# Patient Record
Sex: Male | Born: 1975 | Race: White | Hispanic: No | Marital: Married | Smoking: Former smoker
Health system: Southern US, Community
[De-identification: ages and names within clinical notes are randomized; demographics above are authoritative.]

## PROBLEM LIST (undated history)

## (undated) DIAGNOSIS — K2970 Gastritis, unspecified, without bleeding: Secondary | ICD-10-CM

## (undated) DIAGNOSIS — E66811 Obesity, class 1: Secondary | ICD-10-CM

## (undated) DIAGNOSIS — K2990 Gastroduodenitis, unspecified, without bleeding: Secondary | ICD-10-CM

## (undated) DIAGNOSIS — I10 Essential (primary) hypertension: Secondary | ICD-10-CM

## (undated) DIAGNOSIS — J309 Allergic rhinitis, unspecified: Secondary | ICD-10-CM

## (undated) DIAGNOSIS — L509 Urticaria, unspecified: Secondary | ICD-10-CM

## (undated) DIAGNOSIS — G4733 Obstructive sleep apnea (adult) (pediatric): Secondary | ICD-10-CM

## (undated) DIAGNOSIS — R002 Palpitations: Secondary | ICD-10-CM

## (undated) DIAGNOSIS — M19072 Primary osteoarthritis, left ankle and foot: Secondary | ICD-10-CM

## (undated) DIAGNOSIS — T7840XA Allergy, unspecified, initial encounter: Secondary | ICD-10-CM

## (undated) DIAGNOSIS — N200 Calculus of kidney: Secondary | ICD-10-CM

## (undated) DIAGNOSIS — M19071 Primary osteoarthritis, right ankle and foot: Secondary | ICD-10-CM

## (undated) DIAGNOSIS — E669 Obesity, unspecified: Secondary | ICD-10-CM

## (undated) DIAGNOSIS — G4730 Sleep apnea, unspecified: Secondary | ICD-10-CM

## (undated) DIAGNOSIS — Z52008 Unspecified donor, other blood: Secondary | ICD-10-CM

## (undated) HISTORY — DX: Urticaria, unspecified: L50.9

## (undated) HISTORY — DX: Primary osteoarthritis, right ankle and foot: M19.071

## (undated) HISTORY — DX: Obesity, unspecified: E66.9

## (undated) HISTORY — DX: Obesity, class 1: E66.811

## (undated) HISTORY — DX: Allergic rhinitis, unspecified: J30.9

## (undated) HISTORY — PX: LITHOTRIPSY: SUR834

## (undated) HISTORY — DX: Obstructive sleep apnea (adult) (pediatric): G47.33

## (undated) HISTORY — DX: Gastroduodenitis, unspecified, without bleeding: K29.90

## (undated) HISTORY — DX: Essential (primary) hypertension: I10

## (undated) HISTORY — DX: Gastritis, unspecified, without bleeding: K29.70

## (undated) HISTORY — DX: Unspecified donor, other blood: Z52.008

## (undated) HISTORY — PX: ACHILLES TENDON LENGTHENING: SUR826

## (undated) HISTORY — PX: VASECTOMY: SHX75

## (undated) HISTORY — DX: Allergy, unspecified, initial encounter: T78.40XA

## (undated) HISTORY — DX: Primary osteoarthritis, right ankle and foot: M19.072

## (undated) HISTORY — DX: Palpitations: R00.2

## (undated) HISTORY — DX: Calculus of kidney: N20.0

## (undated) HISTORY — DX: Sleep apnea, unspecified: G47.30

---
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
## 1994-10-23 HISTORY — PX: OTHER SURGICAL HISTORY: SHX169

## 2009-06-09 ENCOUNTER — Emergency Department (HOSPITAL_COMMUNITY): Admission: EM | Admit: 2009-06-09 | Discharge: 2009-06-09 | Payer: Self-pay | Admitting: Emergency Medicine

## 2011-03-31 ENCOUNTER — Ambulatory Visit: Payer: Self-pay | Admitting: Family Medicine

## 2011-04-03 ENCOUNTER — Telehealth: Payer: Self-pay | Admitting: Family Medicine

## 2011-04-03 ENCOUNTER — Encounter: Payer: Self-pay | Admitting: Family Medicine

## 2011-04-03 ENCOUNTER — Ambulatory Visit (INDEPENDENT_AMBULATORY_CARE_PROVIDER_SITE_OTHER): Payer: BC Managed Care – PPO | Admitting: Family Medicine

## 2011-04-03 VITALS — BP 134/83 | HR 70 | Temp 98.2°F | Ht 68.0 in | Wt 202.4 lb

## 2011-04-03 DIAGNOSIS — I10 Essential (primary) hypertension: Secondary | ICD-10-CM

## 2011-04-03 DIAGNOSIS — Z23 Encounter for immunization: Secondary | ICD-10-CM

## 2011-04-03 LAB — POCT URINALYSIS DIPSTICK
Bilirubin, UA: NEGATIVE
Glucose, UA: NEGATIVE
Leukocytes, UA: NEGATIVE
Protein, UA: NEGATIVE
Spec Grav, UA: 1.02

## 2011-04-03 MED ORDER — NEBIVOLOL HCL 10 MG PO TABS: 10.0000 mg | ORAL_TABLET | Freq: Every day | ORAL | Status: DC

## 2011-04-03 NOTE — Telephone Encounter (Signed)
Pls request records from Dr. Myers at [facility] in [place].  Thx--PM 

## 2011-04-03 NOTE — Telephone Encounter (Signed)
Pls request records from Dr. Izola Price at [facility] in [place].  Thx--PM

## 2011-04-03 NOTE — Progress Notes (Signed)
Office Note 04/03/2011  CC:  Chief Complaint  Patient presents with  . Establish Care    new patient  . Hypertension    HPI:  Steve Salinas is a 35 y.o. White male who is here to establish care and discuss HTN. Patient's most recent primary MD: Dr. Izola Price, [facility] in [place] Old records were not reviewed prior to or during today's visit.  Pt dx'd with labile HTN about a year ago (bp up only when in pain/injured), eventually weened himself off of bystolic b/c bp nl and then about 10d ago restarted this b/c of another visit to ED for pain (aggravated old right shoulder injury) and bp way up again.  Of note, he denies any sx's with his high bp (190s syst, 100s diast).  No bp monitoring at home.  Has been doing low Na/Dash-type diet.  Does not exercise but does yard work daily. Nonsmoker.  Past Medical History  Diagnosis Date  . Allergy     seasonal, constant  . Hypertension   . Chicken pox 35 yrs old  . Nephrolithiasis     Past Surgical History  Procedure Date  . Kidney stone removed 96    Dr. Isabel Caprice is urologist locally.  . Lithotripsy 1997 and 2005  . Achilles tendon lengthening 35 yrs old  . Tympanostomy tube placement 1981  . Hernia repair 1994    Family History  Problem Relation Age of Onset  . Hypertension Mother   . Hyperlipidemia Mother   . Arthritis Mother   . Heart attack Mother   . Arthritis Father   . Hyperlipidemia Father   . Hypertension Father   . Heart disease Father     stents  . Hypertension Maternal Grandmother   . Heart attack Maternal Grandfather     History   Social History  . Marital Status: Married    Spouse Name: N/A    Number of Children: N/A  . Years of Education: N/A   Occupational History  . Not on file.   Social History Main Topics  . Smoking status: Former Smoker -- 1.0 packs/day for 11 years    Types: Cigarettes    Quit date: 10/23/1998  . Smokeless tobacco: Never Used  . Alcohol Use: No  . Drug Use: No  . Sexually  Active: Yes -- Male partner(s)   Other Topics Concern  . Not on file   Social History Narrative   Married, 3 young children.Occupation: Systems analyst.Originally from [place].  No tobacco, alc, or drugs.No formal exercise but works in yard daily.No OTC decongestants.  16 oz coffee/day.    Outpatient Encounter Prescriptions as of 04/03/2011  Medication Sig Dispense Refill  . cetirizine (ZYRTEC) 10 MG tablet Take 10 mg by mouth daily.        . nebivolol (BYSTOLIC) 10 MG tablet Take 1 tablet (10 mg total) by mouth daily.  30 tablet  6  . DISCONTD: nebivolol (BYSTOLIC) 10 MG tablet Take 10 mg by mouth daily.          No Known Allergies  ROS Review of Systems  Constitutional: Negative for fever, chills, appetite change and fatigue.  HENT: Negative for ear pain, congestion, sore throat, neck stiffness and dental problem.   Eyes: Negative for discharge, redness and visual disturbance.  Respiratory: Negative for cough, chest tightness, shortness of breath and wheezing.   Cardiovascular: Negative for chest pain, palpitations and leg swelling.  Gastrointestinal: Negative for nausea, vomiting, abdominal pain, diarrhea and blood in stool.  Genitourinary: Negative for dysuria, urgency, frequency, hematuria, flank pain and difficulty urinating.  Musculoskeletal: Positive for arthralgias (right shoulder injured playing pick-up football again about 2 wks ago). Negative for myalgias, back pain and joint swelling.  Skin: Negative for pallor and rash.  Neurological: Negative for dizziness, speech difficulty, weakness and headaches.  Hematological: Negative for adenopathy. Does not bruise/bleed easily.  Psychiatric/Behavioral: Negative for confusion and sleep disturbance. The patient is not nervous/anxious.     PE; Blood pressure 134/83, pulse 70, temperature 98.2 F (36.8 C), temperature source Oral, height 5\' 8"  (1.727 m), weight 202 lb 6.4 oz (91.808 kg), SpO2 99.00%. Gen: Alert, well  appearing.  Patient is oriented to person, place, time, and situation. HEENT: Scalp without lesions or hair loss.  Ears: EACs clear, normal epithelium.  TMs with good light reflex and landmarks bilaterally.  Eyes: no injection, icteris, swelling, or exudate.  EOMI, PERRLA. Nose: no drainage or turbinate edema/swelling.  No injection or focal lesion.  Mouth: lips without lesion/swelling.  Oral mucosa pink and moist.  Dentition intact and without obvious caries or gingival swelling.  Oropharynx without erythema, exudate, or swelling.  Neck: supple, ROM full.  Carotids 2+ bilat, without bruit.  No lymphadenopathy, thyromegaly, or mass. Chest: symmetric expansion, nonlabored respirations.  Clear and equal breath sounds in all lung fields.   CV: RRR, no m/r/g.  Peripheral pulses 2+ and symmetric. ABD: soft, NT, ND, BS normal.  No hepatospenomegaly or mass.  No bruits. EXT: no clubbing, cyanosis, or edema.   Pertinent labs:  CC UA today: normal  ASSESSMENT AND PLAN:  New patient:   HTN (hypertension), benign Problem stable.  Continue current medications and diet appropriate for this condition.  We have reviewed our general long term plan for this problem and also reviewed symptoms and signs that should prompt the patient to call or return to the office. Samples given and rx sent to pharmacy. Labs ordered for when fasting this week. Will get records from [facility] [place].     Return in about 4 months (around 08/03/2011) for 4 mo for o/v.  Lab visit this week for fasting labs (orders in EPIC).Marland Kitchen

## 2011-04-03 NOTE — Assessment & Plan Note (Addendum)
Problem stable.  Continue current medications and diet appropriate for this condition.  We have reviewed our general long term plan for this problem and also reviewed symptoms and signs that should prompt the patient to call or return to the office. Samples given and rx sent to pharmacy. Labs ordered for when fasting this week. Will get records from [facility] at [place].

## 2011-04-03 NOTE — Assessment & Plan Note (Deleted)
TdaP given today. 

## 2011-04-04 NOTE — Telephone Encounter (Signed)
Faxed request

## 2011-04-05 ENCOUNTER — Other Ambulatory Visit (INDEPENDENT_AMBULATORY_CARE_PROVIDER_SITE_OTHER): Payer: BC Managed Care – PPO

## 2011-04-05 DIAGNOSIS — I10 Essential (primary) hypertension: Secondary | ICD-10-CM

## 2011-04-05 LAB — CBC WITH DIFFERENTIAL/PLATELET
Basophils Relative: 0.6 % (ref 0.0–3.0)
Eosinophils Relative: 3.7 % (ref 0.0–5.0)
HCT: 43.9 % (ref 39.0–52.0)
Hemoglobin: 15.1 g/dL (ref 13.0–17.0)
Lymphs Abs: 1.7 10*3/uL (ref 0.7–4.0)
MCV: 89.8 fl (ref 78.0–100.0)
Monocytes Absolute: 0.6 10*3/uL (ref 0.1–1.0)
Monocytes Relative: 9.1 % (ref 3.0–12.0)
Platelets: 171 10*3/uL (ref 150.0–400.0)
RBC: 4.89 Mil/uL (ref 4.22–5.81)
WBC: 6.1 10*3/uL (ref 4.5–10.5)

## 2011-04-05 LAB — TSH: TSH: 0.96 u[IU]/mL (ref 0.35–5.50)

## 2011-04-05 LAB — COMPREHENSIVE METABOLIC PANEL
Albumin: 4.4 g/dL (ref 3.5–5.2)
BUN: 18 mg/dL (ref 6–23)
CO2: 27 mEq/L (ref 19–32)
GFR: 102.06 mL/min (ref >60.00)
Glucose, Bld: 77 mg/dL (ref 70–99)
Sodium: 138 mEq/L (ref 135–145)
Total Bilirubin: 0.5 mg/dL (ref 0.3–1.2)
Total Protein: 7.4 g/dL (ref 6.0–8.3)

## 2011-04-05 LAB — LIPID PANEL: Cholesterol: 181 mg/dL (ref 0–200)

## 2011-04-18 ENCOUNTER — Encounter: Payer: Self-pay | Admitting: Family Medicine

## 2011-05-29 ENCOUNTER — Encounter: Payer: Self-pay | Admitting: Family Medicine

## 2011-05-29 ENCOUNTER — Ambulatory Visit (INDEPENDENT_AMBULATORY_CARE_PROVIDER_SITE_OTHER): Payer: BC Managed Care – PPO | Admitting: Family Medicine

## 2011-05-29 VITALS — BP 110/72 | HR 64 | Temp 97.4°F | Ht 68.0 in | Wt 203.0 lb

## 2011-05-29 DIAGNOSIS — J209 Acute bronchitis, unspecified: Secondary | ICD-10-CM | POA: Insufficient documentation

## 2011-05-29 MED ORDER — BENZONATATE 100 MG PO CAPS: 100.0000 mg | ORAL_CAPSULE | Freq: Three times a day (TID) | ORAL | Status: AC | PRN

## 2011-05-29 MED ORDER — HYDROCODONE-HOMATROPINE 5-1.5 MG/5ML PO SYRP: ORAL_SOLUTION | ORAL | Status: DC

## 2011-05-29 NOTE — Progress Notes (Signed)
OFFICE NOTE  05/29/2011  CC:  Chief Complaint  Patient presents with  . Cough    hacking     HPI:   Patient is a 35 y.o. Caucasian male who is here for cough. Onset 5d/a, dry and deep/forceful, keeping him up at night lately.  No fever, no wheezing, no SOB. Chest hurts when coughing.  No ST, no HA. Has had some intermittently worsened chronic rhinitis for the last 67mo, gets exposed to family members with URIs and gets worse sx's for a week or so.  Pertinent PMH:  HTN Allergic rhinitis No hx of asthma or tobacco abuse. MEDS;   Outpatient Prescriptions Prior to Visit  Medication Sig Dispense Refill  . cetirizine (ZYRTEC) 10 MG tablet Take 10 mg by mouth daily.        . nebivolol (BYSTOLIC) 10 MG tablet Take 1 tablet (10 mg total) by mouth daily.  30 tablet  6    PE: Blood pressure 110/72, pulse 64, temperature 97.4 F (36.3 C), temperature source Temporal, height 5\' 8"  (1.727 m), weight 203 lb (92.08 kg), SpO2 99.00%. VS: noted--normal. Gen: alert, NAD, NONTOXIC APPEARING. HEENT: eyes without injection, drainage, or swelling.  Ears: EACs clear, TMs with normal light reflex and landmarks.  Nose: Clear rhinorrhea, with some dried, crusty exudate adherent to mildly injected mucosa.  No purulent d/c.  No paranasal sinus TTP.  No facial swelling.  Throat and mouth without focal lesion.  No pharyngial swelling, erythema, or exudate.   Neck: supple, no LAD.   LUNGS: CTA bilat, nonlabored resps.   CV: RRR, no m/r/g. EXT: no c/c/e SKIN: no rash    IMPRESSION AND PLAN:  Acute bronchitis No sign of RAD, no s/s to suggest bacterial etiology. D/C delsym and try tessalon pearls for daytime cough, hycodan hs for night-time cough. Self limited nature of the illness was discussed.  Expect gradual resolution over 2 wks or so. Signs of worsening illness discussed.   Return prn.     FOLLOW UP:  Return if symptoms worsen or fail to improve.

## 2011-05-29 NOTE — Assessment & Plan Note (Signed)
No sign of RAD, no s/s to suggest bacterial etiology. D/C delsym and try tessalon pearls for daytime cough, hycodan hs for night-time cough. Self limited nature of the illness was discussed.  Expect gradual resolution over 2 wks or so. Signs of worsening illness discussed.   Return prn.

## 2011-06-02 ENCOUNTER — Telehealth: Payer: Self-pay | Admitting: Family Medicine

## 2011-06-02 MED ORDER — ALBUTEROL SULFATE HFA 108 (90 BASE) MCG/ACT IN AERS: 2.0000 | INHALATION_SPRAY | Freq: Three times a day (TID) | RESPIRATORY_TRACT | Status: DC | PRN

## 2011-06-02 NOTE — Telephone Encounter (Signed)
See previously

## 2011-06-02 NOTE — Telephone Encounter (Signed)
I just need to know what his symptoms are, coughing, wheezing, SOB, fevers etc. If he is having some SOB and wheezing and has had this on occasion before when he has gotten sick I am willing to let him have an inhaler, ie Ventolin 2 puffs po q 8 hours prn sob/wheeze, disp:#1 no rf. If it is more than that let me know

## 2011-06-02 NOTE — Telephone Encounter (Signed)
Patient said that Dr Milinda Cave asked him to [facility] if he was not better in a few days so that a Rx could be called in for an inhaler. Patient uses [facility] in [facility]

## 2011-06-02 NOTE — Telephone Encounter (Signed)
Please advise 

## 2011-06-02 NOTE — Telephone Encounter (Signed)
His symptoms are coughing and making his stomach hurting.

## 2011-06-05 ENCOUNTER — Telehealth: Payer: Self-pay | Admitting: Family Medicine

## 2011-06-05 MED ORDER — HYDROCODONE-HOMATROPINE 5-1.5 MG/5ML PO SYRP: ORAL_SOLUTION | ORAL | Status: AC

## 2011-06-05 NOTE — Telephone Encounter (Signed)
Patient states his cough is no better, would like a refill on cough syrup

## 2011-06-05 NOTE — Telephone Encounter (Signed)
I spoke to the pt and he states his cough is not getting better.  He is using cough syrup at night that give a few hours releif.  Pt states Tessalon Perles are not helping during the day.  He is using albuterol inhaler every 8 hours as prescribed.  Pt states since Thursday he has noticed a "crackle" or feels like "pop rocks" in his chest.  Pt would like refill on cough syrup, does not want refill on Tessalon perles.  Please advise if pt needs repeat office visit prior to refills.

## 2011-06-05 NOTE — Telephone Encounter (Signed)
Patient can have 1 refill on the cough syrup with same sig and same amount as long as his symptoms are not worse. If he is worsening with worse SOB, cough now productive of green phlegm, fevers etc he should come in for further eval. If he uses the cough syrup for one more week and he is still not better then he needs to come in for repeat evaluation

## 2011-06-05 NOTE — Telephone Encounter (Signed)
Notified pt refill will be sent to pharmacy.  Advised if he is not feeling better in one week he will need office visit.  Pt agreeable.

## 2011-06-12 ENCOUNTER — Encounter: Payer: Self-pay | Admitting: Family Medicine

## 2011-06-12 ENCOUNTER — Ambulatory Visit (INDEPENDENT_AMBULATORY_CARE_PROVIDER_SITE_OTHER): Payer: BC Managed Care – PPO | Admitting: Family Medicine

## 2011-06-12 VITALS — BP 126/76 | HR 75 | Temp 98.1°F | Ht 68.0 in | Wt 202.8 lb

## 2011-06-12 DIAGNOSIS — J189 Pneumonia, unspecified organism: Secondary | ICD-10-CM

## 2011-06-12 DIAGNOSIS — G43109 Migraine with aura, not intractable, without status migrainosus: Secondary | ICD-10-CM | POA: Insufficient documentation

## 2011-06-12 MED ORDER — PREDNISONE 20 MG PO TABS: ORAL_TABLET | ORAL | Status: DC

## 2011-06-12 MED ORDER — BUTALBITAL-ACETAMINOPHEN 50-650 MG PO TABS: ORAL_TABLET | ORAL | Status: DC

## 2011-06-12 MED ORDER — AMOXICILLIN-POT CLAVULANATE 875-125 MG PO TABS: 1.0000 | ORAL_TABLET | Freq: Two times a day (BID) | ORAL | Status: AC

## 2011-06-12 NOTE — Assessment & Plan Note (Signed)
Augmentin 875 bid x 10d. He declined any additional cough meds.

## 2011-06-12 NOTE — Assessment & Plan Note (Signed)
I think his current resp illness is exacerbating this. Will treat w/antibiotics as noted above, plus will add prednisone 40mg  qd x 5d.   I rx'd butalbital/acetamin 50/650, 1-2 q6h prn HA, #30, RF x 1 for abortive med since he really doesn't tolerate typical migraine abortive meds.

## 2011-06-12 NOTE — Progress Notes (Signed)
OFFICE VISIT  06/12/2011   CC:  Chief Complaint  Patient presents with  . Sinusitis  . Migraine    10 in the last 14 days     HPI:    Patient is a 35 y.o. Caucasian male who presents for follow up for bronchitis, plus has been having migraines lately. Still with excessive coughing, although he feels like he has begun to improve the last 3-4 days.  No fevers.  No SOB. No nasal cong/sinus pain.  +yellow phlegm production.  Also has had about 10 migraine HAs in last 2 wks, possibly from the excessive coughing and relative lack of sleep associated with the current resp illness.  Describes classic visual aura--flashing lights/geometric shapes in periphery of visual fields, scotomata.  Then gets HA, some moderate and some severe, usually only go away with lying down and sleeping.  Nausea but no vomiting.  No photo/phonophobia.  He has also been getting some roving numbness as part of his aura.  Says topamax helped minimize the intensity of his HAs when he was on it but frequency of his HAs was always around 2 HA's per month on or off this med.  He weened himself off this med b/c he felt like it sapped his energy and caused him to have a blunted affect. Says triptans and ergotamines have all caused him to feel too much GI illness so he can't take them.    Past Medical History  Diagnosis Date  . Allergy     seasonal, constant  . Hypertension     Normal EKG in ED 06/09/2009  . Migraine with aura     was on low dose topamax in the past (Dr. Lissa Morales).  . Nephrolithiasis     Past Surgical History  Procedure Date  . Kidney stone removed 96    Dr. Isabel Caprice is urologist locally.  . Lithotripsy 1997 and 2005  . Achilles tendon lengthening 35 yrs old  . Tympanostomy tube placement 1981  . Hernia repair 1994    Outpatient Prescriptions Prior to Visit  Medication Sig Dispense Refill  . albuterol (VENTOLIN HFA) 108 (90 BASE) MCG/ACT inhaler Inhale 2 puffs into the lungs every 8 (eight) hours  as needed.  1 Inhaler  0  . cetirizine (ZYRTEC) 10 MG tablet Take 10 mg by mouth daily.        . nebivolol (BYSTOLIC) 10 MG tablet Take 1 tablet (10 mg total) by mouth daily.  30 tablet  6  . dextromethorphan (DELSYM) 30 MG/5ML liquid Take 60 mg by mouth 2 (two) times daily as needed.        Marland Kitchen HYDROcodone-homatropine (HYCODAN) 5-1.5 MG/5ML syrup 1-2 tsp po qhs for cough  120 mL  0    No Known Allergies  ROS As per HPI  PE: Blood pressure 126/76, pulse 75, temperature 98.1 F (36.7 C), temperature source Oral, height 5\' 8"  (1.727 m), weight 202 lb 12.8 oz (91.989 kg), SpO2 96.00%. Gen: Alert, well appearing.  Patient is oriented to person, place, time, and situation. HEENT: Scalp without lesions or hair loss.  Ears: EACs clear, normal epithelium.  TMs with good light reflex and landmarks bilaterally.  Eyes: no injection, icteris, swelling, or exudate.  EOMI, PERRLA. Nose: no drainage or turbinate edema/swelling.  No injection or focal lesion.  Mouth: lips without lesion/swelling.  Oral mucosa pink and moist.  Dentition intact and without obvious caries or gingival swelling.  Oropharynx without erythema, exudate, or swelling.  Neck: supple, ROM full.  Carotids 2+ bilat, without bruit.  No lymphadenopathy, thyromegaly, or mass. Chest: symmetric expansion, nonlabored respirations.  He has inspiratory crackles in anterior chest on the left (lower 1/2), but otherwise BS clear and equal. CV: RRR, no m/r/g.  Peripheral pulses 2+ and symmetric. EXT: no clubbing, cyanosis, or edema.  Neuro: CN 2-12 intact bilaterally, strength 5/5 in proximal and distal upper extremities and lower extremities bilaterally.  No sensory deficits.  No tremor.  No disdiadochokinesis.  No ataxia.  Upper extremity and lower extremity DTRs symmetric.  No pronator drift.  LABS:  none  IMPRESSION AND PLAN:  Pneumonia Augmentin 875 bid x 10d. He declined any additional cough meds.  Headache, classical migraine I think  his current resp illness is exacerbating this. Will treat w/antibiotics as noted above, plus will add prednisone 40mg  qd x 5d.   I rx'd butalbital/acetamin 50/650, 1-2 q6h prn HA, #30, RF x 1 for abortive med since he really doesn't tolerate typical migraine abortive meds.      FOLLOW UP: Return in about 7 days (around 06/19/2011) for f/u pneumonia and migraines.

## 2011-06-19 ENCOUNTER — Encounter: Payer: Self-pay | Admitting: Family Medicine

## 2011-06-19 ENCOUNTER — Ambulatory Visit (INDEPENDENT_AMBULATORY_CARE_PROVIDER_SITE_OTHER): Payer: BC Managed Care – PPO | Admitting: Family Medicine

## 2011-06-19 DIAGNOSIS — G43109 Migraine with aura, not intractable, without status migrainosus: Secondary | ICD-10-CM

## 2011-06-19 DIAGNOSIS — J189 Pneumonia, unspecified organism: Secondary | ICD-10-CM

## 2011-06-19 MED ORDER — KETOROLAC TROMETHAMINE 15.75 MG/SPRAY NA SOLN: 31.5000 mg | Freq: Four times a day (QID) | NASAL | Status: DC | PRN

## 2011-06-19 NOTE — Progress Notes (Signed)
OFFICE NOTE  06/19/2011  CC:  Chief Complaint  Patient presents with  . Pneumonia    feels better depending on the day  . Migraine    eased up a bit, 3 in last week     HPI:   Patient is a 35 y.o. Caucasian male who is here for f/u bronchopneumonia and migraine HAs. He took a 5d course of prednisone and is on day 7 of 10 of augmentin. Feeling better every day but not quite back to 100%.  Has mild SOB when going up stairs, some pruductive cough.  No fevers. No chest pains. Has had only 3 migraines since f/u visit 7d/a, compared to almost daily HAs the 10d prior. Did not get a chance to try the Bupap b/c of being pretty nauseated with his migraines lately and didn't want to turn that into vomiting, which he says is typically what happens with any oral HA med.  Has tried nasal imitrex in the past but still had malaise-type rxn.  Pertinent PMH:  HTN Migraine HAs with aura Nonsmoker  MEDS;   Outpatient Prescriptions Prior to Visit  Medication Sig Dispense Refill  . ACETAMINOPHEN-BUTALBITAL (BUPAP) 50-650 MG TABS 1-2 tabs po q6h prn headache  30 each  2  . albuterol (VENTOLIN HFA) 108 (90 BASE) MCG/ACT inhaler Inhale 2 puffs into the lungs every 8 (eight) hours as needed.  1 Inhaler  0  . amoxicillin-clavulanate (AUGMENTIN) 875-125 MG per tablet Take 1 tablet by mouth 2 (two) times daily.  20 tablet  0  . cetirizine (ZYRTEC) 10 MG tablet Take 10 mg by mouth daily.        . nebivolol (BYSTOLIC) 10 MG tablet Take 1 tablet (10 mg total) by mouth daily.  30 tablet  6  . dextromethorphan (DELSYM) 30 MG/5ML liquid Take 60 mg by mouth 2 (two) times daily as needed.        . predniSONE (DELTASONE) 20 MG tablet 2 tabs po qd x 5d  10 tablet  0    PE: Blood pressure 110/74, pulse 54, temperature 98.2 F (36.8 C), temperature source Oral, height 5\' 8"  (1.727 m), weight 204 lb (92.534 kg), SpO2 97.00%. Gen: Alert, well appearing.  Patient is oriented to person, place, time, and  situation. HEENT: Eyes: no injection, icteris, swelling, or exudate.  EOMI, PERRLA. Nose: no drainage or turbinate edema/swelling.  No injection or focal lesion.  Mouth: lips without lesion/swelling.  Oral mucosa pink and moist.  Dentition intact and without obvious caries or gingival swelling.  Oropharynx without erythema, exudate, or swelling.  Neck: supple, ROM full.  Carotids 2+ bilat, without bruit.  No lymphadenopathy, thyromegaly, or mass. Chest: symmetric expansion, nonlabored respirations.  Clear and equal breath sounds in all lung fields.   CV: RRR, no m/r/g.  Peripheral pulses 2+ and symmetric.  IMPRESSION AND PLAN:  Pneumonia Resolving appropriately. Finish augmentin.  Headache, classical migraine Improving as his respiratory illness/cough is improving. Will do trial of Sprix (nasal ketorolac spray), 1 spray each nostril q6h prn.  Coupon/rx card given. Therapeutic expectations and side effect profile of medication discussed today.  Patient's questions answered.      FOLLOW UP:  Return if symptoms worsen or fail to improve, for he has routine HTN f/u scheduled for October 2012.Marland Kitchen

## 2011-06-19 NOTE — Assessment & Plan Note (Signed)
Resolving appropriately. Finish augmentin.

## 2011-06-19 NOTE — Assessment & Plan Note (Addendum)
Improving as his respiratory illness/cough is improving. Will do trial of Sprix (nasal ketorolac spray), 1 spray each nostril q6h prn.  Coupon/rx card given. Therapeutic expectations and side effect profile of medication discussed today.  Patient's questions answered.

## 2011-08-04 ENCOUNTER — Encounter: Payer: Self-pay | Admitting: Family Medicine

## 2011-08-04 ENCOUNTER — Ambulatory Visit (INDEPENDENT_AMBULATORY_CARE_PROVIDER_SITE_OTHER): Payer: BC Managed Care – PPO | Admitting: Family Medicine

## 2011-08-04 VITALS — BP 122/74 | HR 77 | Temp 98.6°F | Wt 207.0 lb

## 2011-08-04 DIAGNOSIS — Z23 Encounter for immunization: Secondary | ICD-10-CM

## 2011-08-04 DIAGNOSIS — G43109 Migraine with aura, not intractable, without status migrainosus: Secondary | ICD-10-CM

## 2011-08-04 DIAGNOSIS — I10 Essential (primary) hypertension: Secondary | ICD-10-CM

## 2011-08-04 MED ORDER — NEBIVOLOL HCL 10 MG PO TABS: 10.0000 mg | ORAL_TABLET | Freq: Every day | ORAL | Status: DC

## 2011-08-04 NOTE — Assessment & Plan Note (Signed)
Problem stable.  Continue current medications and diet appropriate for this condition.  We have reviewed our general long term plan for this problem and also reviewed symptoms and signs that should prompt the patient to call or return to the office. Samples of bystolic 10mg  given (2 mo supply), plus 90 day supply rx renewal for mail order.

## 2011-08-04 NOTE — Assessment & Plan Note (Signed)
Doing well. He has sprix nasal spray to try for next HA.

## 2011-08-04 NOTE — Progress Notes (Signed)
OFFICE NOTE  08/04/2011  CC:  Chief Complaint  Patient presents with  . Follow-up    BP and headaches     HPI:   Patient is a 35 y.o. Caucasian male who is here for f/u HTN and migraine HAs. Feeling good. Checks bp at home and gets <130/80 consistently.  Watches sodium intake, lives actively but no formal exercise. Compliant with bystolic 100% w/out side effects.  He has not had any HA's since I gave him rx for Sprix nasal spray to try.  As per his hx, his migraines come in spurts and then he has long periods that are headache-free.  Pertinent PMH:  HTN Migraine HA's  MEDS;  Bystolic 10mg  qd is his only daily med.  Outpatient Prescriptions Prior to Visit  Medication Sig Dispense Refill  . ACETAMINOPHEN-BUTALBITAL (BUPAP) 50-650 MG TABS 1-2 tabs po q6h prn headache  30 each  2  . albuterol (VENTOLIN HFA) 108 (90 BASE) MCG/ACT inhaler Inhale 2 puffs into the lungs every 8 (eight) hours as needed.  1 Inhaler  0  . cetirizine (ZYRTEC) 10 MG tablet Take 10 mg by mouth daily.        Marland Kitchen Ketorolac Tromethamine 15.75 MG/SPRAY SOLN Place 31.5 mg into the nose every 6 (six) hours as needed. (1 spray in each nostril every 6 hours as needed for pain.  Do not use for more than 5 consecutive days.  1 each  6  . nebivolol (BYSTOLIC) 10 MG tablet Take 1 tablet (10 mg total) by mouth daily.  30 tablet  6  . HYDROcodone-homatropine (HYCODAN) 5-1.5 MG/5ML syrup At bedtime.        PE: Blood pressure 122/74, pulse 77, temperature 98.6 F (37 C), temperature source Oral, weight 207 lb (93.895 kg), SpO2 97.00%. Gen: Alert, well appearing.  Patient is oriented to person, place, time, and situation. No further exam today.  IMPRESSION AND PLAN:  HTN (hypertension), benign Problem stable.  Continue current medications and diet appropriate for this condition.  We have reviewed our general long term plan for this problem and also reviewed symptoms and signs that should prompt the patient to call or  return to the office. Samples of bystolic 10mg  given (2 mo supply), plus 90 day supply rx renewal for mail order.  Headache, classical migraine Doing well. He has sprix nasal spray to try for next HA.   Flu vaccine given IM today.  FOLLOW UP:  Return in about 6 months (around 02/02/2012) for f/u HTN and migraines.

## 2011-12-21 ENCOUNTER — Encounter: Payer: Self-pay | Admitting: Family Medicine

## 2011-12-21 ENCOUNTER — Ambulatory Visit (INDEPENDENT_AMBULATORY_CARE_PROVIDER_SITE_OTHER): Payer: BC Managed Care – PPO | Admitting: Family Medicine

## 2011-12-21 VITALS — BP 113/80 | HR 65 | Temp 97.7°F | Ht 68.0 in | Wt 205.1 lb

## 2011-12-21 DIAGNOSIS — J209 Acute bronchitis, unspecified: Secondary | ICD-10-CM

## 2011-12-21 MED ORDER — METHYLPREDNISOLONE ACETATE PF 40 MG/ML IJ SUSP
40.0000 mg | Freq: Once | INTRAMUSCULAR | Status: AC
  Administered 2011-12-21: 40 mg via INTRAMUSCULAR

## 2011-12-21 MED ORDER — HYDROCODONE-HOMATROPINE 5-1.5 MG/5ML PO SYRP: ORAL_SOLUTION | ORAL | Status: AC

## 2011-12-21 NOTE — Assessment & Plan Note (Signed)
Mild/mod at this time, with no significant sign of RAD or bacterial infection. Depo-medrol 40mg  IM in office today. Hycodan susp 1-2 tsp q6h prn cough, #163ml, no RF. Call or return if not improving in 4-5 d or if worsening before that.

## 2011-12-21 NOTE — Progress Notes (Signed)
OFFICE NOTE  12/21/2011  CC:  Chief Complaint  Patient presents with  . Cough    X 8-9 days/ cough w/ phelgm  . chest congestion    started upper respiratory and moved to chest     HPI: Patient is a 36 y.o. Caucasian male who is here for cough. Pt presents complaining of respiratory symptoms for 10  days.  Started out mostly nasal congestion/runny nose, sneezing, , ST, and PND cough.  Low grade fever initially, with decreased appetite.  All this has resolved except persistent cough, most of the time coming in "fits" when lying supine, esp at night. No fevers, no wheezing, and no SOB.  No pain in face or teeth.  No significant HA.  Symptoms made worse by supine/night-time.  Symptoms improved by nothing. Smoker? no Recent sick contact? no Muscle or joint aches? no Flu shot this season at least 2 wks ago? yes  ROS: no n/v/d or abdominal pain.  No rash.  No neck stiffness.   +Mild fatigue.  +Mild appetite loss.   Pertinent PMH:  Past Medical History  Diagnosis Date  . Allergy     seasonal, constant  . Hypertension     Normal EKG in ED 06/09/2009  . Migraine with aura     was on low dose topamax in the past (Dr. Lissa Morales).  . Nephrolithiasis    Past surgical, social, and family history reviewed and no changes noted since last office visit.  MEDS:  Outpatient Prescriptions Prior to Visit  Medication Sig Dispense Refill  . cetirizine (ZYRTEC) 10 MG tablet Take 10 mg by mouth daily.        . nebivolol (BYSTOLIC) 10 MG tablet Take 1 tablet (10 mg total) by mouth daily.  90 tablet  3  . Ketorolac Tromethamine 15.75 MG/SPRAY SOLN Place 31.5 mg into the nose every 6 (six) hours as needed. (1 spray in each nostril every 6 hours as needed for pain.  Do not use for more than 5 consecutive days.  1 each  6  . ACETAMINOPHEN-BUTALBITAL (BUPAP) 50-650 MG TABS 1-2 tabs po q6h prn headache  30 each  2  . albuterol (VENTOLIN HFA) 108 (90 BASE) MCG/ACT inhaler Inhale 2 puffs into the lungs every 8  (eight) hours as needed.  1 Inhaler  0    PE: Blood pressure 113/80, pulse 65, temperature 97.7 F (36.5 C), temperature source Temporal, height 5\' 8"  (1.727 m), weight 205 lb 1.9 oz (93.042 kg), SpO2 98.00%. VS: noted--normal. Gen: alert, NAD, NONTOXIC APPEARING. HEENT: eyes without injection, drainage, or swelling.  Ears: EACs clear, TMs with normal light reflex and landmarks.  Nose: Clear rhinorrhea, with some dried, crusty exudate adherent to mildly injected mucosa.  No purulent d/c.  No paranasal sinus TTP.  No facial swelling.  Throat and mouth without focal lesion.  No pharyngial swelling, erythema, or exudate.   Neck: supple, no LAD.   LUNGS: CTA bilat, nonlabored resps.   CV: RRR, no m/r/g. EXT: no c/c/e SKIN: no rash    IMPRESSION AND PLAN: Acute bronchitis Mild/mod at this time, with no significant sign of RAD or bacterial infection. Depo-medrol 40mg  IM in office today. Hycodan susp 1-2 tsp q6h prn cough, #128ml, no RF. Call or return if not improving in 4-5 d or if worsening before that.      FOLLOW UP: prn

## 2012-02-02 ENCOUNTER — Encounter: Payer: Self-pay | Admitting: Family Medicine

## 2012-02-02 ENCOUNTER — Ambulatory Visit (INDEPENDENT_AMBULATORY_CARE_PROVIDER_SITE_OTHER): Payer: BC Managed Care – PPO | Admitting: Family Medicine

## 2012-02-02 VITALS — BP 117/83 | HR 62 | Temp 97.6°F | Ht 68.0 in | Wt 210.0 lb

## 2012-02-02 DIAGNOSIS — Z23 Encounter for immunization: Secondary | ICD-10-CM

## 2012-02-02 DIAGNOSIS — I10 Essential (primary) hypertension: Secondary | ICD-10-CM

## 2012-02-02 DIAGNOSIS — Z7189 Other specified counseling: Secondary | ICD-10-CM

## 2012-02-02 DIAGNOSIS — IMO0002 Reserved for concepts with insufficient information to code with codable children: Secondary | ICD-10-CM

## 2012-02-02 MED ORDER — NEBIVOLOL HCL 10 MG PO TABS: 10.0000 mg | ORAL_TABLET | Freq: Every day | ORAL | Status: DC

## 2012-02-02 NOTE — Progress Notes (Signed)
Addended by: Luisa Dago on: 02/02/2012 09:49 AM   Modules accepted: Orders

## 2012-02-02 NOTE — Assessment & Plan Note (Addendum)
Reviewed CDC website for travel rec's/vaccine rec's and he needs Hep B series so we started this today. He has had Hep A already. He needs oral typhoid vaccine as well (can be done a few weeks prior to leaving) and will need malaria prophylaxis.  Rabies vaccine is recommended for high risk travelers but he does NOT meet these high risk criteria so he doesn't need this. He'll return in >30 days for Hep B #2.

## 2012-02-02 NOTE — Progress Notes (Signed)
OFFICE VISIT  02/02/2012   CC:  Chief Complaint  Patient presents with  . Follow-up    no problems     HPI:    Patient is a 36 y.o. Caucasian male who presents for routine HTN f/u. Feels well, compliant with med.  No exercise. Home bp checks 130s/80s.  Still no exercise but active in yard.  No dietary changes.  Has tried the sprix nasal spray HA med on 3 occasions and feels like it has been pretty helpful.  Will be traveling to [place] in October 2013, asks about vaccines needed.  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  Past Medical History  Diagnosis Date  . Allergy     seasonal, constant  . Hypertension     Normal EKG in ED 06/09/2009  . Migraine with aura     was on low dose topamax in the past (Dr. Lissa Morales).  . Nephrolithiasis     Past Surgical History  Procedure Date  . Kidney stone removed 96    Dr. Isabel Caprice is urologist locally.  . Lithotripsy 1997 and 2005  . Achilles tendon lengthening 36 yrs old  . Tympanostomy tube placement 1981  . Hernia repair 1994    Outpatient Prescriptions Prior to Visit  Medication Sig Dispense Refill  . ACETAMINOPHEN-BUTALBITAL (BUPAP) 50-650 MG TABS 1-2 tabs po q6h prn headache  30 each  2  . albuterol (VENTOLIN HFA) 108 (90 BASE) MCG/ACT inhaler Inhale 2 puffs into the lungs every 8 (eight) hours as needed.  1 Inhaler  0  . cetirizine (ZYRTEC) 10 MG tablet Take 10 mg by mouth daily.        . nebivolol (BYSTOLIC) 10 MG tablet Take 1 tablet (10 mg total) by mouth daily.  90 tablet  3    No Known Allergies  ROS As per HPI  PE: Blood pressure 117/83, pulse 62, temperature 97.6 F (36.4 C), temperature source Temporal, height 5\' 8"  (1.727 m), weight 210 lb (95.255 kg). Gen: Alert, well appearing.  Patient is oriented to person, place, time, and situation. Neck - No masses or thyromegaly or limitation in range of motion.  Carotids 2+ bilat, no bruits. CV: RRR, no m/r/g.   LUNGS: CTA bilat,  nonlabored resps, good aeration in all lung fields. EXT: no clubbing, cyanosis, or edema.   LABS:  None today  IMPRESSION AND PLAN:  Advice or immunization for travel Reviewed CDC website for travel rec's/vaccine rec's and he needs Hep B series so we started this today. He has had Hep A already. He needs oral typhoid vaccine as well (can be done a few weeks prior to leaving) and will need malaria prophylaxis.  Rabies vaccine is recommended for high risk travelers but he does NOT meet these high risk criteria so he doesn't need this. He'll return in >30 days for Hep B #2.  HTN (hypertension), benign Problem stable.  Continue current medications and diet appropriate for this condition.  We have reviewed our general long term plan for this problem and also reviewed symptoms and signs that should prompt the patient to call or return to the office.      FOLLOW UP: Return for 4-6 mo for CPE and fasting labs.

## 2012-02-02 NOTE — Assessment & Plan Note (Signed)
Problem stable.  Continue current medications and diet appropriate for this condition.  We have reviewed our general long term plan for this problem and also reviewed symptoms and signs that should prompt the patient to call or return to the office.  

## 2012-03-04 ENCOUNTER — Ambulatory Visit (INDEPENDENT_AMBULATORY_CARE_PROVIDER_SITE_OTHER): Payer: BC Managed Care – PPO | Admitting: *Deleted

## 2012-03-04 DIAGNOSIS — Z23 Encounter for immunization: Secondary | ICD-10-CM

## 2012-03-04 NOTE — Progress Notes (Signed)
Pt returned for 2nd Hep B injection.  Pt tolerated well in right arm.  He will return in October for injection #3.

## 2012-05-21 ENCOUNTER — Encounter: Payer: Self-pay | Admitting: Family Medicine

## 2012-07-23 ENCOUNTER — Ambulatory Visit (INDEPENDENT_AMBULATORY_CARE_PROVIDER_SITE_OTHER): Payer: BC Managed Care – PPO | Admitting: Family Medicine

## 2012-07-23 ENCOUNTER — Other Ambulatory Visit: Payer: Self-pay | Admitting: Family Medicine

## 2012-07-23 ENCOUNTER — Encounter: Payer: Self-pay | Admitting: Family Medicine

## 2012-07-23 VITALS — BP 113/74 | HR 65 | Temp 97.1°F | Ht 68.0 in | Wt 210.0 lb

## 2012-07-23 DIAGNOSIS — Z23 Encounter for immunization: Secondary | ICD-10-CM

## 2012-07-23 DIAGNOSIS — I10 Essential (primary) hypertension: Secondary | ICD-10-CM

## 2012-07-23 MED ORDER — ATOVAQUONE-PROGUANIL HCL 250-100 MG PO TABS: 1.0000 | ORAL_TABLET | Freq: Every day | ORAL | Status: DC

## 2012-07-23 MED ORDER — FELODIPINE ER 10 MG PO TB24: 10.0000 mg | ORAL_TABLET | Freq: Every day | ORAL | Status: DC

## 2012-07-23 MED ORDER — TYPHOID VACCINE PO CPDR: DELAYED_RELEASE_CAPSULE | ORAL | Status: DC

## 2012-07-23 NOTE — Assessment & Plan Note (Signed)
Change to felodipine 10mg  qd.  D/C bystolic.

## 2012-07-23 NOTE — Progress Notes (Addendum)
OFFICE NOTE  07/23/2012  CC:  Chief Complaint  Patient presents with  . Follow-up    adjust medication  . Injections    flu     HPI: Patient is a 36 y.o. Caucasian male who is here for 6 mo f/u HTN.  He has been having vivid dreams for the last year approx, about the same amount of time he's been on bystolic.  He is interested in trying a different med to see if they go away.  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 BP control at home has been 120s over 80s and HR in 60s.  Pertinent PMH:  Past Medical History  Diagnosis Date  . Allergy     seasonal, constant  . Hypertension     Normal EKG in ED 06/09/2009  . Migraine with aura     was on low dose topamax in the past (Dr. Lissa Morales).  . Nephrolithiasis     Has required lithotripsy and ureteroscopy in the past.    MEDS:  Outpatient Prescriptions Prior to Visit  Medication Sig Dispense Refill  . cetirizine (ZYRTEC) 10 MG tablet Take 10 mg by mouth daily.        . nebivolol (BYSTOLIC) 10 MG tablet Take 1 tablet (10 mg total) by mouth daily.  90 tablet  3  . ACETAMINOPHEN-BUTALBITAL (BUPAP) 50-650 MG TABS 1-2 tabs po q6h prn headache  30 each  2  . Ketorolac Tromethamine (SPRIX) 15.75 MG/SPRAY SOLN Place into the nose as needed.      Marland Kitchen albuterol (VENTOLIN HFA) 108 (90 BASE) MCG/ACT inhaler Inhale 2 puffs into the lungs every 8 (eight) hours as needed.  1 Inhaler  0    PE: Blood pressure 113/74, pulse 65, temperature 97.1 F (36.2 C), temperature source Temporal, height 5\' 8"  (1.727 m), weight 210 lb (95.255 kg), SpO2 99.00%. Gen: Alert, well appearing.  Patient is oriented to person, place, time, and situation. AFFECT: pleasant, lucid thought and speech. CV: RRR, no m/r/g.   LUNGS: CTA bilat, nonlabored resps, good aeration in all lung fields. EXT: no clubbing, cyanosis, or edema.   IMPRESSION AND PLAN:  HTN (hypertension), benign Change to felodipine 10mg  qd.  D/C bystolic.  Continue home bp monitoring.  Call  or return if vivid dreams don't resolve with this change.  Flu vaccine IM today.  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  An After Visit Summary was printed and given to the patient.    FOLLOW UP: 6 mo, earlier if needed.

## 2012-08-05 ENCOUNTER — Ambulatory Visit: Payer: BC Managed Care – PPO

## 2012-08-05 DIAGNOSIS — Z23 Encounter for immunization: Secondary | ICD-10-CM

## 2012-08-05 NOTE — Progress Notes (Signed)
  Subjective:    Patient ID: Steve Salinas, male    DOB: [birth year], 36 y.o.   MRN: [id number]  HPI    Review of Systems     Objective:   Physical Exam        Assessment & Plan:  Patient came in today for his 3rd Hep B injection. Patient tolerated injection well.

## 2012-08-28 ENCOUNTER — Other Ambulatory Visit: Payer: Self-pay | Admitting: Orthopedic Surgery

## 2012-08-28 MED ORDER — NEBIVOLOL HCL 10 MG PO TABS: 10.0000 mg | ORAL_TABLET | Freq: Every day | ORAL | Status: DC

## 2012-08-28 NOTE — Telephone Encounter (Signed)
Patient was supposed to come by and pick up an RX but he will not be able to make it this week and would like to know if it can be faxed to him at Fax [phone]

## 2012-08-28 NOTE — Telephone Encounter (Signed)
Pt stated that he is still having bad dreams on new BP med-Felodipine.  Pt states Bystolic was working well and he would prefer to use that as he knows this works.  Per Dr. Milinda Cave this is OK.  Pt would like 90 day refill of Bystolic.  Pt requested this on Monday, but I forgot to put in request.  Advised pt I can not fax RX to him as pharmacy will not accept.  He will try to come pick up tomorrow AM.  Offered twice on this call to send to pharmacy for patient.  He declined.  Also offered on Monday when patient called. Last seen-07/23/12 Follow up - 6 months RX printed.

## 2012-10-30 ENCOUNTER — Ambulatory Visit (INDEPENDENT_AMBULATORY_CARE_PROVIDER_SITE_OTHER): Payer: BC Managed Care – PPO | Admitting: Family Medicine

## 2012-10-30 ENCOUNTER — Encounter: Payer: Self-pay | Admitting: Family Medicine

## 2012-10-30 VITALS — BP 126/83 | HR 61 | Temp 97.2°F | Ht 68.0 in | Wt 213.0 lb

## 2012-10-30 DIAGNOSIS — J209 Acute bronchitis, unspecified: Secondary | ICD-10-CM

## 2012-10-30 DIAGNOSIS — B9689 Other specified bacterial agents as the cause of diseases classified elsewhere: Secondary | ICD-10-CM

## 2012-10-30 DIAGNOSIS — J029 Acute pharyngitis, unspecified: Secondary | ICD-10-CM

## 2012-10-30 DIAGNOSIS — H1089 Other conjunctivitis: Secondary | ICD-10-CM

## 2012-10-30 DIAGNOSIS — H6690 Otitis media, unspecified, unspecified ear: Secondary | ICD-10-CM

## 2012-10-30 DIAGNOSIS — H109 Unspecified conjunctivitis: Secondary | ICD-10-CM | POA: Insufficient documentation

## 2012-10-30 DIAGNOSIS — J069 Acute upper respiratory infection, unspecified: Secondary | ICD-10-CM

## 2012-10-30 DIAGNOSIS — A499 Bacterial infection, unspecified: Secondary | ICD-10-CM

## 2012-10-30 LAB — POCT RAPID STREP A (OFFICE): Rapid Strep A Screen: NEGATIVE

## 2012-10-30 MED ORDER — AMOXICILLIN-POT CLAVULANATE 875-125 MG PO TABS: 1.0000 | ORAL_TABLET | Freq: Two times a day (BID) | ORAL | Status: DC

## 2012-10-30 MED ORDER — HYDROCODONE-HOMATROPINE 5-1.5 MG/5ML PO SYRP: ORAL_SOLUTION | ORAL | Status: DC

## 2012-10-30 MED ORDER — POLYMYXIN B-TRIMETHOPRIM 10000-0.1 UNIT/ML-% OP SOLN: OPHTHALMIC | Status: DC

## 2012-10-30 NOTE — Assessment & Plan Note (Signed)
He has a viral URI/pharyngitis + bronchitis. No sign of RAD. Self-limited nature of this illness was discussed, questions answered.  Discussed symptomatic care, rest, fluids.   Warning signs/symptoms of worsening illness were discussed.  Patient instructed to call or return if any of these occur. Mucinex DM recommended. Hycodan 1-2 tsp q6h prn cough, #158ml, no RF.

## 2012-10-30 NOTE — Progress Notes (Signed)
OFFICE NOTE  10/30/2012  CC:  Chief Complaint  Patient presents with  . Cough    cough, ST, head/chest congestion; worse in chest     HPI: Patient is a 37 y.o. Caucasian male who is here for eye and resp sx's. Pt presents complaining of respiratory symptoms for 5  days.  Primary symptoms are: ST initially, followed by nasal congestion/runny nose/PND, and lately prominent cough.  Worst symptoms seems to be the cough lately, initially the ST was the worst.  Lately the symptoms seem to be worsening regarding the cough.  Pertinent negatives: No fevers, no wheezing, and no SOB.  No pain in face or teeth.  No significant HA.  Symptoms made worse by nothing.  Symptoms improved by ibuprofen/acet (ST). Smoker? no Recent sick contact? Yes--kids with  Recent URI, AOM, and conjunctivitis. Muscle or joint aches? no Flu shot this season at least 2 wks ago? yes  Additional ROS: no n/v/d or abdominal pain.  No rash.  No neck stiffness.   +Mild fatigue.  +Mild appetite loss.  +gummy eyes in evenings mostly, some redness of eyes, too.  Of note, he had a similar illness about 6-7 wks ago and was out of town and was given empiric penicillin and he thinks this may have helped (rapid strep neg per his report)  Pertinent PMH:  Past Medical History  Diagnosis Date  . Allergy     seasonal, constant  . Hypertension     Normal EKG in ED 06/09/2009  . Migraine with aura     was on low dose topamax in the past (Dr. Lissa Morales).  . Nephrolithiasis     Has required lithotripsy and ureteroscopy in the past.    MEDS:  Outpatient Prescriptions Prior to Visit  Medication Sig Dispense Refill  . ACETAMINOPHEN-BUTALBITAL (BUPAP) 50-650 MG TABS 1-2 tabs po q6h prn headache  30 each  2  . cetirizine (ZYRTEC) 10 MG tablet Take 10 mg by mouth daily.        . nebivolol (BYSTOLIC) 10 MG tablet Take 1 tablet (10 mg total) by mouth daily.  90 tablet  1  . [DISCONTINUED] atovaquone-proguanil (MALARONE) 250-100 MG TABS Take  1 tablet by mouth daily. 1 tab po qd starting 2 days prior to potential exposure and ending 7 days after potential exposure to malaria  30 tablet  1  . [DISCONTINUED] Ketorolac Tromethamine (SPRIX) 15.75 MG/SPRAY SOLN Place into the nose as needed.      . [DISCONTINUED] montelukast (SINGULAIR) 10 MG tablet 10 mg.      . [DISCONTINUED] typhoid (VIVOTIF) DR capsule 1 cap qod x 4 doses  4 capsule  0  Last reviewed on 10/30/2012  1:17 PM by Steve Massed, MD  PE: Blood pressure 126/83, pulse 61, temperature 97.2 F (36.2 C), temperature source Temporal, height 5\' 8"  (1.727 m), weight 213 lb (96.616 kg), SpO2 99.00%. VS: noted--normal. Gen: alert, NAD, WELL- APPEARING. HEENT: eyes without significant injection or swelling.  He has some yellowish mucous collected in medial canthus region bilat.  EOMI, PERRLA.  Ears: EACs clear, TMs with normal light reflex and landmarks.  Nose: Clear rhinorrhea, with some dried, crusty exudate adherent to mildly injected mucosa.  No purulent d/c.  No paranasal sinus TTP.  No facial swelling.  Throat and mouth without focal lesion.  No pharyngial swelling, erythema, or exudate.   Neck: supple, no LAD.   LUNGS: CTA bilat, nonlabored resps.  Forced exp maneuver did not elicit wheeze or post-exhalation  coughing.  Good aeration. CV: RRR, no m/r/g. EXT: no c/c/e SKIN: no rash  Rapid strep: NEG  IMPRESSION AND PLAN:  Acute bronchitis He has a viral URI/pharyngitis + bronchitis. No sign of RAD. Self-limited nature of this illness was discussed, questions answered.  Discussed symptomatic care, rest, fluids.   Warning signs/symptoms of worsening illness were discussed.  Patient instructed to call or return if any of these occur. Mucinex DM recommended. Hycodan 1-2 tsp q6h prn cough, #138ml, no RF.  Acute otitis media Right side. Recent amoxil/penicillin rx'd by out of town MD 08/2012, so I'll treat with augmentin 875 bid x 7d.  Bacterial conjunctivitis Given his  kids dx recently plus the fact that he has an AOM, this is likely bacterial (even though his eyes look pretty benign today). Polytrim gtts, 2 gtts each eye qid x 7d.  An After Visit Summary was printed and given to the patient.  FOLLOW UP: prn

## 2012-10-30 NOTE — Assessment & Plan Note (Signed)
Right side. Recent amoxil/penicillin rx'd by out of town MD 08/2012, so I'll treat with augmentin 875 bid x 7d.

## 2012-10-30 NOTE — Assessment & Plan Note (Signed)
Given his kids dx recently plus the fact that he has an AOM, this is likely bacterial (even though his eyes look pretty benign today). Polytrim gtts, 2 gtts each eye qid x 7d.

## 2012-10-30 NOTE — Addendum Note (Signed)
Addended by: Baldemar Lenis R on: 10/30/2012 02:22 PM   Modules accepted: Orders

## 2013-05-08 ENCOUNTER — Encounter: Payer: Self-pay | Admitting: Family Medicine

## 2013-05-26 ENCOUNTER — Encounter: Payer: Self-pay | Admitting: Family Medicine

## 2013-07-09 ENCOUNTER — Ambulatory Visit (INDEPENDENT_AMBULATORY_CARE_PROVIDER_SITE_OTHER): Payer: BC Managed Care – PPO | Admitting: Family Medicine

## 2013-07-09 ENCOUNTER — Other Ambulatory Visit: Payer: Self-pay

## 2013-07-09 DIAGNOSIS — Z111 Encounter for screening for respiratory tuberculosis: Secondary | ICD-10-CM

## 2013-07-11 ENCOUNTER — Ambulatory Visit (INDEPENDENT_AMBULATORY_CARE_PROVIDER_SITE_OTHER): Payer: BC Managed Care – PPO | Admitting: Family Medicine

## 2013-07-11 ENCOUNTER — Encounter: Payer: Self-pay | Admitting: Family Medicine

## 2013-07-11 VITALS — BP 123/87 | HR 73 | Temp 97.8°F | Resp 16 | Ht 68.0 in | Wt 211.0 lb

## 2013-07-11 DIAGNOSIS — Z0000 Encounter for general adult medical examination without abnormal findings: Secondary | ICD-10-CM

## 2013-07-11 DIAGNOSIS — Z23 Encounter for immunization: Secondary | ICD-10-CM

## 2013-07-11 LAB — URINALYSIS, ROUTINE W REFLEX MICROSCOPIC
Nitrite: NEGATIVE
RBC / HPF: NONE SEEN (ref 0–?)
Specific Gravity, Urine: >=1.03 (ref 1.000–1.030)
Urine Glucose: NEGATIVE
Urobilinogen, UA: 0.2 (ref 0.0–1.0)

## 2013-07-11 LAB — COMPREHENSIVE METABOLIC PANEL
ALT: 31 U/L (ref 0–53)
BUN: 15 mg/dL (ref 6–23)
CO2: 29 mEq/L (ref 19–32)
Calcium: 9.4 mg/dL (ref 8.4–10.5)
Chloride: 102 mEq/L (ref 96–112)
Creatinine, Ser: 1 mg/dL (ref 0.4–1.5)
GFR: 88.16 mL/min (ref >60.00)
Total Bilirubin: 0.7 mg/dL (ref 0.3–1.2)

## 2013-07-11 LAB — CBC WITH DIFFERENTIAL/PLATELET
Basophils Absolute: 0 10*3/uL (ref 0.0–0.1)
Hemoglobin: 14.3 g/dL (ref 13.0–17.0)
Lymphocytes Relative: 30.2 % (ref 12.0–46.0)
Monocytes Relative: 7.9 % (ref 3.0–12.0)
Neutro Abs: 3.1 10*3/uL (ref 1.4–7.7)
Neutrophils Relative %: 58.3 % (ref 43.0–77.0)
Platelets: 188 10*3/uL (ref 150.0–400.0)
RDW: 13.4 % (ref 11.5–14.6)

## 2013-07-11 LAB — LDL CHOLESTEROL, DIRECT: Direct LDL: 126.7 mg/dL

## 2013-07-11 LAB — TB SKIN TEST: TB Skin Test: NEGATIVE

## 2013-07-11 LAB — TSH: TSH: 1.12 u[IU]/mL (ref 0.35–5.50)

## 2013-07-11 LAB — LIPID PANEL
Cholesterol: 195 mg/dL (ref 0–200)
Total CHOL/HDL Ratio: 7
Triglycerides: 201 mg/dL — ABNORMAL HIGH (ref 0.0–149.0)
VLDL: 40.2 mg/dL — ABNORMAL HIGH (ref 0.0–40.0)

## 2013-07-11 NOTE — Progress Notes (Signed)
Office Note 07/11/2013  CC:  Chief Complaint  Patient presents with  . Annual Exam    adoption paper work to fill out    HPI:  Steve Salinas is a 37 y.o. White male who is here for CPE. No acute complaints.  He has some paperwork in prep for adoption of a child from [place]. I filled this out and we did labs required, letter required documenting his suitability for adoption. Since I last saw him he did see a headache MD who felt like his sleep problems were impacting his HA's, did a sleep study and he was found to have OSA and is now on CPAP and doing much better--has approx 3 HA's a month.   Past Medical History  Diagnosis Date  . Allergy     seasonal, constant  . Hypertension     Normal EKG in ED 06/09/2009  . Migraine with aura     was on low dose topamax in the past (Dr. Lissa Morales).  Most recent neuro eval 05/08/13 by Dr. Daphine Deutscher at [facility] in [place]--started Keppra ER 500mg  qd, diclofenac 25mg  delayed release tabs--1 q6h prn HA, ordered MRI brain (NORMAL 05/08/13) and sleep evaluation.  . Nephrolithiasis     Has required lithotripsy and ureteroscopy in the past.  . OSA (obstructive sleep apnea)     13 1/2 cm H20    Past Surgical History  Procedure Laterality Date  . Kidney stone removed  96    Dr. Isabel Caprice is urologist locally.  . Lithotripsy  1997 and 2005  . Achilles tendon lengthening  37 yrs old  . Tympanostomy tube placement  1981  . Hernia repair  1994    Family History  Problem Relation Age of Onset  . Hypertension Mother   . Hyperlipidemia Mother   . Arthritis Mother   . Heart attack Mother   . Arthritis Father   . Hyperlipidemia Father   . Hypertension Father   . Heart disease Father     stents  . Hypertension Maternal Grandmother   . Heart attack Maternal Grandfather     History   Social History  . Marital Status: Married    Spouse Name: N/A    Number of Children: N/A  . Years of Education: N/A   Occupational History  . Not  on file.   Social History Main Topics  . Smoking status: Former Smoker -- 1.00 packs/day for 11 years    Types: Cigarettes    Quit date: 10/23/1998  . Smokeless tobacco: Never Used  . Alcohol Use: No  . Drug Use: No  . Sexual Activity: Yes    Partners: Female   Other Topics Concern  . Not on file   Social History Narrative   Married, 3 young children.   Occupation: Systems analyst.   Originally from [place].  No tobacco, alc, or drugs.   No formal exercise but works in yard daily.   No OTC decongestants.  16 oz coffee/day.    Outpatient Prescriptions Prior to Visit  Medication Sig Dispense Refill  . ACETAMINOPHEN-BUTALBITAL (BUPAP) 50-650 MG TABS 1-2 tabs po q6h prn headache  30 each  2  . cetirizine (ZYRTEC) 10 MG tablet Take 10 mg by mouth daily.        . nebivolol (BYSTOLIC) 10 MG tablet Take 1 tablet (10 mg total) by mouth daily.  90 tablet  1  . amoxicillin-clavulanate (AUGMENTIN) 875-125 MG per tablet Take 1 tablet by mouth 2 (two) times daily.  14 tablet  0  . HYDROcodone-homatropine (HYCODAN) 5-1.5 MG/5ML syrup 1-2 tsp po q6h prn cough  120 mL  0  . trimethoprim-polymyxin b (POLYTRIM) ophthalmic solution 2 drops in each eye qid x 7d  10 mL  2   No facility-administered medications prior to visit.    No Known Allergies  ROS Review of Systems  Constitutional: Negative for fever, chills, appetite change and fatigue.  HENT: Negative for ear pain, congestion, sore throat, neck stiffness and dental problem.   Eyes: Negative for discharge, redness and visual disturbance.  Respiratory: Negative for cough, chest tightness, shortness of breath and wheezing.   Cardiovascular: Negative for chest pain, palpitations and leg swelling.  Gastrointestinal: Negative for nausea, vomiting, abdominal pain, diarrhea and blood in stool.  Genitourinary: Negative for dysuria, urgency, frequency, hematuria, flank pain and difficulty urinating.  Musculoskeletal: Negative for myalgias,  back pain, joint swelling and arthralgias.  Skin: Negative for pallor and rash.  Neurological: Negative for dizziness, speech difficulty, weakness and headaches.  Hematological: Negative for adenopathy. Does not bruise/bleed easily.  Psychiatric/Behavioral: Negative for confusion and sleep disturbance. The patient is not nervous/anxious.      PE; Blood pressure 123/87, pulse 73, temperature 97.8 F (36.6 C), temperature source Temporal, resp. rate 16, height 5\' 8"  (1.727 m), weight 211 lb (95.709 kg), SpO2 98.00%. Gen: Alert, well appearing.  Patient is oriented to person, place, time, and situation. AFFECT: pleasant, lucid thought and speech. ENT: Ears: EACs clear, normal epithelium.  TMs with good light reflex and landmarks bilaterally.  Eyes: no injection, icteris, swelling, or exudate.  EOMI, PERRLA. Nose: no drainage or turbinate edema/swelling.  No injection or focal lesion.  Mouth: lips without lesion/swelling.  Oral mucosa pink and moist.  Dentition intact and without obvious caries or gingival swelling.  Oropharynx without erythema, exudate, or swelling.  Neck: supple/nontender.  No LAD, mass, or TM.  Carotid pulses 2+ bilaterally, without bruits. CV: RRR, no m/r/g.   LUNGS: CTA bilat, nonlabored resps, good aeration in all lung fields. ABD: soft, NT, ND, BS normal.  No hepatospenomegaly or mass.  No bruits. EXT: no clubbing, cyanosis, or edema.  Musculoskeletal: no joint swelling, erythema, warmth, or tenderness.  ROM of all joints intact. Skin - no sores or suspicious lesions or rashes or color changes  Pertinent labs:  TB test today negative  ASSESSMENT AND PLAN:   Health maintenance examination Reviewed age and gender appropriate health maintenance issues (prudent diet, regular exercise, health risks of tobacco and excessive alcohol, use of seatbelts, fire alarms in home, use of sunscreen).  Also reviewed age and gender appropriate health screening as well as vaccine  recommendations. Health panel today + UA and HIV testing as requested by adoption agency ([facility]). TB test read today: negative. No new probs or meds today.  No RF's needed. Paperwork for adoption agency filled out, letter written per their request.  An After Visit Summary was printed and given to the patient.  FOLLOW UP:  Return in about 1 year (around 07/11/2014) for CPE.

## 2013-07-11 NOTE — Assessment & Plan Note (Signed)
Reviewed age and gender appropriate health maintenance issues (prudent diet, regular exercise, health risks of tobacco and excessive alcohol, use of seatbelts, fire alarms in home, use of sunscreen).  Also reviewed age and gender appropriate health screening as well as vaccine recommendations. Health panel today + UA and HIV testing as requested by adoption agency ([facility]). TB test read today: negative. No new probs or meds today.  No RF's needed. Paperwork for adoption agency filled out, letter written per their request.

## 2013-09-26 DIAGNOSIS — D229 Melanocytic nevi, unspecified: Secondary | ICD-10-CM | POA: Insufficient documentation

## 2014-02-18 ENCOUNTER — Encounter: Payer: Self-pay | Admitting: Family Medicine

## 2014-04-10 ENCOUNTER — Ambulatory Visit (INDEPENDENT_AMBULATORY_CARE_PROVIDER_SITE_OTHER): Payer: BC Managed Care – PPO | Admitting: Nurse Practitioner

## 2014-04-10 ENCOUNTER — Encounter: Payer: Self-pay | Admitting: Nurse Practitioner

## 2014-04-10 VITALS — BP 109/69 | HR 73 | Temp 97.4°F | Ht 68.0 in | Wt 214.0 lb

## 2014-04-10 DIAGNOSIS — R300 Dysuria: Secondary | ICD-10-CM

## 2014-04-10 DIAGNOSIS — R3915 Urgency of urination: Secondary | ICD-10-CM

## 2014-04-10 LAB — POCT URINALYSIS DIPSTICK
BILIRUBIN UA: NEGATIVE
GLUCOSE UA: NEGATIVE
Ketones, UA: NEGATIVE
Leukocytes, UA: NEGATIVE
Nitrite, UA: NEGATIVE
Protein, UA: NEGATIVE
RBC UA: NEGATIVE
Urobilinogen, UA: 0.2
pH, UA: 5.5

## 2014-04-10 LAB — URINALYSIS, MICROSCOPIC ONLY

## 2014-04-10 NOTE — Patient Instructions (Signed)
Urine does not appear to have infection, but culture will be best test. Results will be back on Monday.  We will call with results. If there is no infection, an ultrasound may be helpful. Increase fluids. Your urine is concentrated. Sip every hour.

## 2014-04-10 NOTE — Progress Notes (Signed)
Pre visit review using our clinic review tool, if applicable. No additional management support is needed unless otherwise documented below in the visit note. 

## 2014-04-10 NOTE — Progress Notes (Signed)
   Subjective:    Patient ID: Steve Salinas, male    DOB: Aug 05, 1976, 38 y.o.   MRN: [id number]  Urinary Tract Infection  This is a recurrent problem. The current episode started 1 to 4 weeks ago (1 wk). The problem occurs intermittently. The problem has been waxing and waning. The quality of the pain is described as aching. The pain is mild. There has been no fever. He is sexually active. There is no history of pyelonephritis. Associated symptoms include flank pain and urgency. Pertinent negatives include no chills, frequency, nausea or vomiting. He has tried nothing for the symptoms. His past medical history is significant for kidney stones (multiple).      Review of Systems  Constitutional: Negative for fever, chills and fatigue.  HENT: Negative for congestion.   Respiratory: Negative for cough.   Gastrointestinal: Negative for nausea, vomiting and abdominal pain.  Genitourinary: Positive for urgency and flank pain. Negative for frequency, discharge and testicular pain.       Objective:   Physical Exam  Vitals reviewed. Constitutional: He is oriented to person, place, and time. He appears well-developed and well-nourished.  HENT:  Head: Normocephalic and atraumatic.  Eyes: Conjunctivae are normal. Right eye exhibits no discharge. Left eye exhibits no discharge.  Cardiovascular: Normal rate.   Pulmonary/Chest: Effort normal. No respiratory distress.  Abdominal: Soft. He exhibits no distension and no mass. There is no tenderness. There is no rebound and no guarding.  Genitourinary:  Denies scrotal swelling, pain  Musculoskeletal: He exhibits no tenderness (no CVA tenderness).  Neurological: He is alert and oriented to person, place, and time.  Skin: Skin is warm and dry.  Psychiatric: He has a normal mood and affect. His behavior is normal. Thought content normal.          Assessment & Plan:  1. Dysuria  2. Urgency of urination - POCT urinalysis dipstick-SG 1.030. No  blood - Urine Microscopic - Urine culture  Stones? Increase water intake. Does not feel needs STI testing.

## 2014-04-11 ENCOUNTER — Other Ambulatory Visit: Payer: Self-pay | Admitting: Nurse Practitioner

## 2014-04-12 LAB — URINE CULTURE
Colony Count: NO GROWTH
ORGANISM ID, BACTERIA: NO GROWTH

## 2014-04-13 ENCOUNTER — Telehealth: Payer: Self-pay | Admitting: Family Medicine

## 2014-04-13 NOTE — Telephone Encounter (Signed)
pls notify pt that his urine culture did not show any infection. See how his symptoms are doing.-thx

## 2014-04-14 NOTE — Telephone Encounter (Signed)
Patient states that he passed two kidney stones last night and he is now feeling back to normal.

## 2014-04-14 NOTE — Telephone Encounter (Signed)
Noted  

## 2014-10-23 DIAGNOSIS — R002 Palpitations: Secondary | ICD-10-CM

## 2014-10-23 HISTORY — DX: Palpitations: R00.2

## 2014-12-01 ENCOUNTER — Other Ambulatory Visit (INDEPENDENT_AMBULATORY_CARE_PROVIDER_SITE_OTHER): Payer: BLUE CROSS/BLUE SHIELD

## 2014-12-01 ENCOUNTER — Other Ambulatory Visit: Payer: Self-pay | Admitting: Family Medicine

## 2014-12-01 DIAGNOSIS — R002 Palpitations: Secondary | ICD-10-CM

## 2014-12-01 DIAGNOSIS — Z0000 Encounter for general adult medical examination without abnormal findings: Secondary | ICD-10-CM

## 2014-12-01 LAB — COMPREHENSIVE METABOLIC PANEL
ALBUMIN: 4.4 g/dL (ref 3.5–5.2)
ALT: 26 U/L (ref 0–53)
AST: 17 U/L (ref 0–37)
Alkaline Phosphatase: 78 U/L (ref 39–117)
BUN: 19 mg/dL (ref 6–23)
CO2: 29 mEq/L (ref 19–32)
Calcium: 9.7 mg/dL (ref 8.4–10.5)
Chloride: 102 mEq/L (ref 96–112)
Creatinine, Ser: 0.95 mg/dL (ref 0.40–1.50)
GFR: 92.88 mL/min (ref >60.00)
GLUCOSE: 87 mg/dL (ref 70–99)
POTASSIUM: 4.8 meq/L (ref 3.5–5.1)
SODIUM: 136 meq/L (ref 135–145)
Total Bilirubin: 0.5 mg/dL (ref 0.2–1.2)
Total Protein: 7.1 g/dL (ref 6.0–8.3)

## 2014-12-01 LAB — CBC WITH DIFFERENTIAL/PLATELET
BASOS PCT: 0.3 % (ref 0.0–3.0)
Basophils Absolute: 0 10*3/uL (ref 0.0–0.1)
EOS PCT: 3.8 % (ref 0.0–5.0)
Eosinophils Absolute: 0.2 10*3/uL (ref 0.0–0.7)
HEMATOCRIT: 41.8 % (ref 39.0–52.0)
Hemoglobin: 14.4 g/dL (ref 13.0–17.0)
LYMPHS ABS: 1.7 10*3/uL (ref 0.7–4.0)
Lymphocytes Relative: 30.2 % (ref 12.0–46.0)
MCHC: 34.5 g/dL (ref 30.0–36.0)
MCV: 89.7 fl (ref 78.0–100.0)
MONO ABS: 0.6 10*3/uL (ref 0.1–1.0)
Monocytes Relative: 11.1 % (ref 3.0–12.0)
NEUTROS PCT: 54.6 % (ref 43.0–77.0)
Neutro Abs: 3.1 10*3/uL (ref 1.4–7.7)
Platelets: 193 10*3/uL (ref 150.0–400.0)
RBC: 4.66 Mil/uL (ref 4.22–5.81)
RDW: 14 % (ref 11.5–15.5)
WBC: 5.6 10*3/uL (ref 4.0–10.5)

## 2014-12-01 LAB — LIPID PANEL
Cholesterol: 192 mg/dL (ref 0–200)
HDL: 29.5 mg/dL — ABNORMAL LOW (ref >39.00)
NonHDL: 162.5
Total CHOL/HDL Ratio: 7
Triglycerides: 215 mg/dL — ABNORMAL HIGH (ref 0.0–149.0)
VLDL: 43 mg/dL — ABNORMAL HIGH (ref 0.0–40.0)

## 2014-12-01 LAB — URINALYSIS, ROUTINE W REFLEX MICROSCOPIC
BILIRUBIN URINE: NEGATIVE
HGB URINE DIPSTICK: NEGATIVE
KETONES UR: NEGATIVE
Leukocytes, UA: NEGATIVE
Nitrite: NEGATIVE
Specific Gravity, Urine: 1.025 (ref 1.000–1.030)
Total Protein, Urine: NEGATIVE
URINE GLUCOSE: NEGATIVE
UROBILINOGEN UA: 0.2 (ref 0.0–1.0)
WBC UA: NONE SEEN — AB (ref 0–?)
pH: 6 (ref 5.0–8.0)

## 2014-12-01 LAB — TSH: TSH: 1.77 u[IU]/mL (ref 0.35–4.50)

## 2014-12-01 LAB — LDL CHOLESTEROL, DIRECT: LDL DIRECT: 117 mg/dL

## 2014-12-02 ENCOUNTER — Other Ambulatory Visit: Payer: Self-pay | Admitting: *Deleted

## 2014-12-02 ENCOUNTER — Ambulatory Visit (INDEPENDENT_AMBULATORY_CARE_PROVIDER_SITE_OTHER): Payer: BLUE CROSS/BLUE SHIELD | Admitting: Family Medicine

## 2014-12-02 ENCOUNTER — Encounter: Payer: Self-pay | Admitting: Family Medicine

## 2014-12-02 VITALS — BP 109/75 | HR 67 | Temp 97.6°F | Ht 68.0 in | Wt 225.0 lb

## 2014-12-02 DIAGNOSIS — Z23 Encounter for immunization: Secondary | ICD-10-CM

## 2014-12-02 DIAGNOSIS — Z0000 Encounter for general adult medical examination without abnormal findings: Secondary | ICD-10-CM

## 2014-12-02 DIAGNOSIS — R002 Palpitations: Secondary | ICD-10-CM

## 2014-12-02 MED ORDER — NEBIVOLOL HCL 10 MG PO TABS: 10.0000 mg | ORAL_TABLET | Freq: Every day | ORAL | Status: DC

## 2014-12-02 NOTE — Assessment & Plan Note (Signed)
Reviewed age and gender appropriate health maintenance issues (prudent diet, regular exercise, health risks of tobacco and excessive alcohol, use of seatbelts, fire alarms in home, use of sunscreen).  Also reviewed age and gender appropriate health screening as well as vaccine recommendations. Flu vaccine IM today. All health panel labs + UA normal except mild hypertriglyceridemia and low HDL. Discussed TLC/goal wt loss of about 20 lbs should help this significantly.  No cholesterol-lowering meds indicated at this time.

## 2014-12-02 NOTE — Progress Notes (Signed)
Pre visit review using our clinic review tool, if applicable. No additional management support is needed unless otherwise documented below in the visit note. 

## 2014-12-02 NOTE — Assessment & Plan Note (Signed)
Fortunately these seem to have resolved. Continue with w/u that is already set in motion by Dr. Shelia Media: Holter monitor starting tomorrow, cardiology consult 12/09/14.

## 2014-12-02 NOTE — Progress Notes (Signed)
Office Note 12/02/2014  CC:  Chief Complaint  Patient presents with  . Annual Exam    HPI:  Steve Salinas is a 39 y.o. White male who is here for CPE. Reviewed recent fasting health panel labs + UA in detail with pt today. He has no complaints today.  Needs health clearance form signed so he can continue with the process of adopting a child.  Has had recent palpitations about a month ago, went to see Dr. Shelia Media in [place] at the suggestion of a friend, EKG ok per pt.  No recurrence since 10/2014.  Holter scheduled for pick up tomorrow. Cardiology appt 1 wk from now.   Past Medical History  Diagnosis Date  . Allergy     seasonal, constant  . Hypertension     Normal EKG in ED 06/09/2009  . Migraine with aura     was on low dose topamax in the past (Dr. Ron Parker).  Most recent neuro eval 05/08/13 by Dr. Hassell Done at [facility] [place]--started [place] ER 500mg  qd, diclofenac 25mg  delayed release tabs--1 q6h prn HA, ordered MRI brain (NORMAL 05/08/13) and sleep evaluation.  . Nephrolithiasis     Has required lithotripsy and ureteroscopy in the past.  . OSA (obstructive sleep apnea)     13 1/2 cm H20.  As of 02/10/14, pt is set to get fitted for oral appliance as therapy for his OSA  . Palpitations 10/2014    awaiting cardiology consult (EKG wnl per pt report --done at internist's office in [place] (Dr. Shelia Media).    Past Surgical History  Procedure Laterality Date  . Kidney stone removed  96    Dr. Risa Grill is urologist locally.  . Lithotripsy  1997 and 2005  . Achilles tendon lengthening  39 yrs old  . Tympanostomy tube placement  1981  . Hernia repair  1994    Family History  Problem Relation Age of Onset  . Hypertension Mother   . Hyperlipidemia Mother   . Arthritis Mother   . Heart attack Mother   . Arthritis Father   . Hyperlipidemia Father   . Hypertension Father   . Heart disease Father     stents  . Hypertension Maternal Grandmother   . Heart attack Maternal  Grandfather     History   Social History  . Marital Status: Married    Spouse Name: N/A  . Number of Children: N/A  . Years of Education: N/A   Occupational History  . Not on file.   Social History Main Topics  . Smoking status: Former Smoker -- 1.00 packs/day for 11 years    Types: Cigarettes    Quit date: 10/23/1998  . Smokeless tobacco: Never Used  . Alcohol Use: No  . Drug Use: No  . Sexual Activity:    Partners: Female   Other Topics Concern  . Not on file   Social History Narrative   Married, 3 young children.   Occupation: Gaffer.   Originally from [place].  No tobacco, alc, or drugs.   No formal exercise but works in yard daily.   No OTC decongestants.  16 oz coffee/day.    Outpatient Prescriptions Prior to Visit  Medication Sig Dispense Refill  . cetirizine (ZYRTEC) 10 MG tablet Take 10 mg by mouth daily.      . nebivolol (BYSTOLIC) 10 MG tablet Take 1 tablet (10 mg total) by mouth daily. 90 tablet 1   No facility-administered medications prior to visit.  No Known Allergies  ROS Review of Systems  Constitutional: Negative for fever, chills, appetite change and fatigue.  HENT: Negative for congestion, dental problem, ear pain and sore throat.   Eyes: Negative for discharge, redness and visual disturbance.  Respiratory: Negative for cough, chest tightness, shortness of breath and wheezing.   Cardiovascular: Negative for chest pain, palpitations and leg swelling.  Gastrointestinal: Negative for nausea, vomiting, abdominal pain, diarrhea and blood in stool.  Genitourinary: Negative for dysuria, urgency, frequency, hematuria, flank pain and difficulty urinating.  Musculoskeletal: Negative for myalgias, back pain, joint swelling, arthralgias and neck stiffness.  Skin: Negative for pallor and rash.  Neurological: Negative for dizziness, speech difficulty, weakness and headaches.  Hematological: Negative for adenopathy. Does not bruise/bleed  easily.  Psychiatric/Behavioral: Negative for confusion and sleep disturbance. The patient is not nervous/anxious.     PE; Blood pressure 109/75, pulse 67, temperature 97.6 F (36.4 C), temperature source Temporal, height 5\' 8"  (1.727 m), weight 225 lb (102.059 kg), SpO2 96 %. Gen: Alert, well appearing.  Patient is oriented to person, place, time, and situation. AFFECT: pleasant, lucid thought and speech. ENT: Ears: EACs clear, normal epithelium.  TMs with good light reflex and landmarks bilaterally.  Eyes: no injection, icteris, swelling, or exudate.  EOMI, PERRLA. Nose: no drainage or turbinate edema/swelling.  No injection or focal lesion.  Mouth: lips without lesion/swelling.  Oral mucosa pink and moist.  Dentition intact and without obvious caries or gingival swelling.  Oropharynx without erythema, exudate, or swelling.  Neck: supple/nontender.  No LAD, mass, or TM.  Carotid pulses 2+ bilaterally, without bruits. CV: RRR, no m/r/g.   LUNGS: CTA bilat, nonlabored resps, good aeration in all lung fields. ABD: soft, NT, ND, BS normal.  No hepatospenomegaly or mass.  No bruits. EXT: no clubbing, cyanosis, or edema.  Musculoskeletal: no joint swelling, erythema, warmth, or tenderness.  ROM of all joints intact. Skin - no sores or suspicious lesions or rashes or color changes  Pertinent labs:  Lab Results  Component Value Date   TSH 1.77 12/01/2014   Lab Results  Component Value Date   WBC 5.6 12/01/2014   HGB 14.4 12/01/2014   HCT 41.8 12/01/2014   MCV 89.7 12/01/2014   PLT 193.0 12/01/2014   Lab Results  Component Value Date   CREATININE 0.95 12/01/2014   BUN 19 12/01/2014   NA 136 12/01/2014   K 4.8 12/01/2014   CL 102 12/01/2014   CO2 29 12/01/2014   Lab Results  Component Value Date   ALT 26 12/01/2014   AST 17 12/01/2014   ALKPHOS 78 12/01/2014   BILITOT 0.5 12/01/2014   Lab Results  Component Value Date   CHOL 192 12/01/2014   Lab Results  Component Value  Date   HDL 29.50* 12/01/2014   Lab Results  Component Value Date   LDLCALC 125* 04/05/2011   Lab Results  Component Value Date   TRIG 215.0* 12/01/2014   Lab Results  Component Value Date   CHOLHDL 7 12/01/2014    ASSESSMENT AND PLAN:   Health maintenance examination Reviewed age and gender appropriate health maintenance issues (prudent diet, regular exercise, health risks of tobacco and excessive alcohol, use of seatbelts, fire alarms in home, use of sunscreen).  Also reviewed age and gender appropriate health screening as well as vaccine recommendations. Flu vaccine IM today. All health panel labs + UA normal except mild hypertriglyceridemia and low HDL. Discussed TLC/goal wt loss of about 20 lbs should help  this significantly.  No cholesterol-lowering meds indicated at this time.   Palpitations Fortunately these seem to have resolved. Continue with w/u that is already set in motion by Dr. Shelia Media: Holter monitor starting tomorrow, cardiology consult 12/09/14.   An After Visit Summary was printed and given to the patient.  FOLLOW UP:  Return in about 1 year (around 12/03/2015) for annual CPE (fasting).

## 2014-12-03 ENCOUNTER — Encounter (INDEPENDENT_AMBULATORY_CARE_PROVIDER_SITE_OTHER): Payer: BLUE CROSS/BLUE SHIELD

## 2014-12-03 ENCOUNTER — Encounter: Payer: Self-pay | Admitting: *Deleted

## 2014-12-03 DIAGNOSIS — R002 Palpitations: Secondary | ICD-10-CM

## 2014-12-03 NOTE — Progress Notes (Signed)
Patient ID: Steve Salinas, male   DOB: [birth year], 38 y.o.   MRN: [id number] [employer] 24 hour holter monitor applied to patient.

## 2014-12-04 ENCOUNTER — Other Ambulatory Visit (HOSPITAL_COMMUNITY): Payer: Self-pay | Admitting: Internal Medicine

## 2014-12-04 ENCOUNTER — Ambulatory Visit (HOSPITAL_COMMUNITY): Payer: BLUE CROSS/BLUE SHIELD | Attending: Internal Medicine

## 2014-12-04 DIAGNOSIS — R002 Palpitations: Secondary | ICD-10-CM

## 2014-12-08 ENCOUNTER — Encounter (HOSPITAL_COMMUNITY): Payer: Self-pay | Admitting: Internal Medicine

## 2014-12-09 ENCOUNTER — Ambulatory Visit (INDEPENDENT_AMBULATORY_CARE_PROVIDER_SITE_OTHER): Payer: BLUE CROSS/BLUE SHIELD | Admitting: Cardiovascular Disease

## 2014-12-09 ENCOUNTER — Encounter: Payer: Self-pay | Admitting: Cardiovascular Disease

## 2014-12-09 VITALS — BP 118/90 | HR 67 | Ht 68.0 in | Wt 228.6 lb

## 2014-12-09 DIAGNOSIS — R002 Palpitations: Secondary | ICD-10-CM

## 2014-12-09 DIAGNOSIS — I10 Essential (primary) hypertension: Secondary | ICD-10-CM

## 2014-12-09 NOTE — Progress Notes (Signed)
Cardiology Office Note   Date:  12/09/2014   ID:  Steve Salinas, DOB 20-Nov-1975, MRN [id number]  PCP:  Tammi Sou, MD  Cardiologist:   Thayer Headings, MD   Chief Complaint  Patient presents with  . Palpitations   Problem List 1. Essential HTN 2. Palpitations 3. Obstructive sleep apnea - uses CPAP     History of Present Illness: Steve Salinas is a 39 y.o. male who presents for evaluation of palpitations Steve Salinas has a hx of palpitations that clinically sound like PVCs.  The other day at work, he had a prolonged episode  - 10 seconds.  Associated with ringing in his ears. No dyspnea or CP.   He started breathing deeply and slowly and then he converted out of the is arrhyhtmia.  Has gain weight over the past several years.  Started on a diet and exercise program.  The exercise is going well. Rides a stationary bike  Has difficulty getting his HR up since starting the bystolic.   Has had HTN  For 2 years.    Has tried several BP meds in the past but they caused shortness of breath     Past Medical History  Diagnosis Date  . Allergy     seasonal, constant  . Hypertension     Normal EKG in ED 06/09/2009  . Migraine with aura     was on low dose topamax in the past (Dr. Ron Parker).  Most recent neuro eval 05/08/13 by Dr. Hassell Done at [facility] in [place]--started [place] ER 500mg  qd, diclofenac 25mg  delayed release tabs--1 q6h prn HA, ordered MRI brain (NORMAL 05/08/13) and sleep evaluation.  . Nephrolithiasis     Has required lithotripsy and ureteroscopy in the past.  . OSA (obstructive sleep apnea)     13 1/2 cm H20.  As of 02/10/14, pt is set to get fitted for oral appliance as therapy for his OSA  . Palpitations 10/2014    awaiting cardiology consult (EKG wnl per pt report --done at internist's office in [place] (Dr. Shelia Media).    Past Surgical History  Procedure Laterality Date  . Kidney stone removed  96    Dr. Risa Grill is urologist locally.  . Lithotripsy  1997  and 2005  . Achilles tendon lengthening  39 yrs old  . Tympanostomy tube placement  1981  . Hernia repair  1994     Current Outpatient Prescriptions  Medication Sig Dispense Refill  . cetirizine (ZYRTEC) 10 MG tablet Take 10 mg by mouth daily.      . nebivolol (BYSTOLIC) 10 MG tablet Take 1 tablet (10 mg total) by mouth daily. 90 tablet 3   No current facility-administered medications for this visit.    Allergies:   Review of patient's allergies indicates no known allergies.    Social History:  The patient  reports that he quit smoking about 16 years ago. His smoking use included Cigarettes. He has a 11 pack-year smoking history. He has never used smokeless tobacco. He reports that he does not drink alcohol or use illicit drugs.   Family History:  The patient's family history includes Arthritis in his father and mother; Heart attack in his maternal grandfather and mother; Heart disease in his father; Hyperlipidemia in his father and mother; Hypertension in his father, maternal grandmother, and mother.    ROS:  Please see the history of present illness.    Review of Systems: Constitutional:  denies fever, chills, diaphoresis, appetite change and fatigue.  HEENT: denies photophobia, eye pain, redness, hearing loss, ear pain, congestion, sore throat, rhinorrhea, sneezing, neck pain, neck stiffness and tinnitus.  Respiratory: denies SOB, DOE, cough, chest tightness, and wheezing.  Cardiovascular: denies chest pain, palpitations and leg swelling.  Gastrointestinal: denies nausea, vomiting, abdominal pain, diarrhea, constipation, blood in stool.  Genitourinary: denies dysuria, urgency, frequency, hematuria, flank pain and difficulty urinating.  Musculoskeletal: denies  myalgias, back pain, joint swelling, arthralgias and gait problem.   Skin: denies pallor, rash and wound.  Neurological: denies dizziness, seizures, syncope, weakness, light-headedness, numbness and headaches.     Hematological: denies adenopathy, easy bruising, personal or family bleeding history.  Psychiatric/ Behavioral: denies suicidal ideation, mood changes, confusion, nervousness, sleep disturbance and agitation.       All other systems are reviewed and negative.    PHYSICAL EXAM: VS:  BP 118/90 mmHg  Pulse 67  Ht 5\' 8"  (1.727 m)  Wt 228 lb 9.6 oz (103.692 kg)  BMI 34.77 kg/m2 , BMI Body mass index is 34.77 kg/(m^2). GEN: Well nourished, well developed, in no acute distress HEENT: normal Neck: no JVD, carotid bruits, or masses Cardiac: RRR; no murmurs, rubs, or gallops,no edema  Respiratory:  clear to auscultation bilaterally, normal work of breathing GI: soft, nontender, nondistended, + BS MS: no deformity or atrophy Skin: warm and dry, no rash Neuro:  Strength and sensation are intact Psych: normal   EKG:  EKG is ordered today. The ekg ordered today demonstrates  Normal sinus rhythm at 67.  No ST or T wave changes   Recent Labs: 12/01/2014: ALT 26; BUN 19; Creatinine 0.95; Hemoglobin 14.4; Platelets 193.0; Potassium 4.8; Sodium 136; TSH 1.77    Lipid Panel    Component Value Date/Time   CHOL 192 12/01/2014 0818   TRIG 215.0* 12/01/2014 0818   HDL 29.50* 12/01/2014 0818   CHOLHDL 7 12/01/2014 0818   VLDL 43.0* 12/01/2014 0818   LDLCALC 125* 04/05/2011 0800   LDLDIRECT 117.0 12/01/2014 0818      Wt Readings from Last 3 Encounters:  12/09/14 228 lb 9.6 oz (103.692 kg)  12/02/14 225 lb (102.059 kg)  04/10/14 214 lb (97.07 kg)      Other studies Reviewed: Additional studies/ records that were reviewed today include: records from Dr. Shelia Media. Review of the above records demonstrates:  PACs, PVCS    ASSESSMENT AND PLAN:  1. Essential HTN  -  Steve Salinas presents with palpitations. He also has had some hypertension. He's noticed that his heart rate is well-controlled with the Bystolic  But is not able to get his heart rate very high when he exercises. We may consider  decreasing the dose of his beta blocker and starting him on an ACE inhibitor or ARB at his next office visit.  2. Palpitations -  He has palpitations that are probably premature ventricular contractions. He had one episode of more prolonged palpitations that I think could've been due to SVT. I've instructed him on how to do the Valsalva maneuver and also how to do the diving reflex. Continue current medications. I'll see him in 3 months and we'll review how he's doing.  3. Obstructive sleep apnea - uses CPAP -  I've encouraged him to continue with his weight loss efforts. Continue using CPAP.  4. Migraine headaches.    Current medicines are reviewed at length with the patient today.  The patient does not have concerns regarding medicines.  The following changes have been made:  no change   Disposition:  FU with me in 3 months     Signed, Nahser, Wonda Cheng, MD  12/09/2014 2:57 PM    [facility] [address], [place], [place]  [address] Phone: [phone]; Fax: [phone]

## 2014-12-09 NOTE — Patient Instructions (Addendum)
Your physician recommends that you continue on your current medications as directed. Please refer to the Current Medication list given to you today.  Your physician recommends that you schedule a follow-up appointment in: 3 months with Dr. Acie Fredrickson.    Your echocardiogram is scheduled for Tuesday, Feb. 23 at 1:00 pm @ [facility] office; please arrive 15 minutes early - there are no special instructions for this test

## 2014-12-10 ENCOUNTER — Encounter: Payer: Self-pay | Admitting: Family Medicine

## 2014-12-15 ENCOUNTER — Other Ambulatory Visit (HOSPITAL_COMMUNITY): Payer: BLUE CROSS/BLUE SHIELD

## 2014-12-23 ENCOUNTER — Ambulatory Visit (HOSPITAL_COMMUNITY): Payer: BLUE CROSS/BLUE SHIELD | Attending: Cardiovascular Disease | Admitting: Radiology

## 2014-12-23 DIAGNOSIS — R002 Palpitations: Secondary | ICD-10-CM | POA: Insufficient documentation

## 2014-12-23 NOTE — Progress Notes (Signed)
Echocardiogram performed.  

## 2015-03-17 ENCOUNTER — Emergency Department (HOSPITAL_BASED_OUTPATIENT_CLINIC_OR_DEPARTMENT_OTHER): Payer: BLUE CROSS/BLUE SHIELD

## 2015-03-17 ENCOUNTER — Emergency Department (HOSPITAL_BASED_OUTPATIENT_CLINIC_OR_DEPARTMENT_OTHER)
Admission: EM | Admit: 2015-03-17 | Discharge: 2015-03-17 | Disposition: A | Discharge status: Home / Self Care | Payer: BLUE CROSS/BLUE SHIELD | Attending: Emergency Medicine | Admitting: Emergency Medicine

## 2015-03-17 ENCOUNTER — Encounter (HOSPITAL_BASED_OUTPATIENT_CLINIC_OR_DEPARTMENT_OTHER): Payer: Self-pay

## 2015-03-17 ENCOUNTER — Encounter: Payer: Self-pay | Admitting: Cardiovascular Disease

## 2015-03-17 ENCOUNTER — Ambulatory Visit (INDEPENDENT_AMBULATORY_CARE_PROVIDER_SITE_OTHER): Payer: BLUE CROSS/BLUE SHIELD | Admitting: Cardiovascular Disease

## 2015-03-17 VITALS — BP 138/84 | HR 80 | Ht 68.0 in | Wt 203.0 lb

## 2015-03-17 DIAGNOSIS — R002 Palpitations: Secondary | ICD-10-CM | POA: Diagnosis not present

## 2015-03-17 DIAGNOSIS — K819 Cholecystitis, unspecified: Secondary | ICD-10-CM | POA: Diagnosis not present

## 2015-03-17 DIAGNOSIS — Z87442 Personal history of urinary calculi: Secondary | ICD-10-CM | POA: Diagnosis not present

## 2015-03-17 DIAGNOSIS — I10 Essential (primary) hypertension: Secondary | ICD-10-CM | POA: Insufficient documentation

## 2015-03-17 DIAGNOSIS — Z9889 Other specified postprocedural states: Secondary | ICD-10-CM | POA: Diagnosis not present

## 2015-03-17 DIAGNOSIS — Z8669 Personal history of other diseases of the nervous system and sense organs: Secondary | ICD-10-CM | POA: Insufficient documentation

## 2015-03-17 DIAGNOSIS — R1013 Epigastric pain: Secondary | ICD-10-CM | POA: Diagnosis present

## 2015-03-17 DIAGNOSIS — Z87891 Personal history of nicotine dependence: Secondary | ICD-10-CM | POA: Diagnosis not present

## 2015-03-17 DIAGNOSIS — Z79899 Other long term (current) drug therapy: Secondary | ICD-10-CM | POA: Diagnosis not present

## 2015-03-17 LAB — CBC
HCT: 40.5 % (ref 39.0–52.0)
Hemoglobin: 13.9 g/dL (ref 13.0–17.0)
MCH: 30.3 pg (ref 26.0–34.0)
MCHC: 34.3 g/dL (ref 30.0–36.0)
MCV: 88.2 fL (ref 78.0–100.0)
Platelets: 188 10*3/uL (ref 150–400)
RBC: 4.59 MIL/uL (ref 4.22–5.81)
RDW: 13.3 % (ref 11.5–15.5)
WBC: 6.2 10*3/uL (ref 4.0–10.5)

## 2015-03-17 LAB — COMPREHENSIVE METABOLIC PANEL
ALK PHOS: 85 U/L (ref 38–126)
ALT: 25 U/L (ref 17–63)
AST: 20 U/L (ref 15–41)
Albumin: 4.4 g/dL (ref 3.5–5.0)
Anion gap: 8 (ref 5–15)
BUN: 17 mg/dL (ref 6–20)
CHLORIDE: 100 mmol/L — AB (ref 101–111)
CO2: 32 mmol/L (ref 22–32)
Calcium: 9 mg/dL (ref 8.9–10.3)
Creatinine, Ser: 0.9 mg/dL (ref 0.61–1.24)
Glucose, Bld: 100 mg/dL — ABNORMAL HIGH (ref 65–99)
Potassium: 3.9 mmol/L (ref 3.5–5.1)
Sodium: 140 mmol/L (ref 135–145)
TOTAL PROTEIN: 7.7 g/dL (ref 6.5–8.1)
Total Bilirubin: 0.6 mg/dL (ref 0.3–1.2)

## 2015-03-17 LAB — URINALYSIS, ROUTINE W REFLEX MICROSCOPIC
BILIRUBIN URINE: NEGATIVE
Glucose, UA: NEGATIVE mg/dL
Hgb urine dipstick: NEGATIVE
Ketones, ur: NEGATIVE mg/dL
LEUKOCYTES UA: NEGATIVE
Nitrite: NEGATIVE
PH: 8 (ref 5.0–8.0)
PROTEIN: NEGATIVE mg/dL
Specific Gravity, Urine: 1.017 (ref 1.005–1.030)
UROBILINOGEN UA: 0.2 mg/dL (ref 0.0–1.0)

## 2015-03-17 LAB — LIPASE, BLOOD: LIPASE: 33 U/L (ref 22–51)

## 2015-03-17 LAB — TROPONIN I: Troponin I: <0.03 ng/mL (ref <0.031)

## 2015-03-17 MED ORDER — MORPHINE SULFATE 4 MG/ML IJ SOLN
4.0000 mg | Freq: Once | INTRAMUSCULAR | Status: AC
  Administered 2015-03-17: 4 mg via INTRAVENOUS
  Filled 2015-03-17: qty 1

## 2015-03-17 MED ORDER — METOPROLOL TARTRATE 50 MG PO TABS
25.0000 mg | ORAL_TABLET | Freq: Once | ORAL | Status: DC
  Filled 2015-03-17: qty 1

## 2015-03-17 MED ORDER — OXYCODONE-ACETAMINOPHEN 5-325 MG PO TABS: 1.0000 | ORAL_TABLET | Freq: Four times a day (QID) | ORAL | Status: DC | PRN

## 2015-03-17 MED ORDER — ONDANSETRON HCL 4 MG/2ML IJ SOLN
4.0000 mg | Freq: Once | INTRAMUSCULAR | Status: AC
  Administered 2015-03-17: 4 mg via INTRAVENOUS
  Filled 2015-03-17: qty 2

## 2015-03-17 MED ORDER — ONDANSETRON 4 MG PO TBDP: ORAL_TABLET | ORAL | Status: DC

## 2015-03-17 MED ORDER — NEBIVOLOL HCL 10 MG PO TABS: 10.0000 mg | ORAL_TABLET | Freq: Every day | ORAL | Status: DC

## 2015-03-17 MED ORDER — NEBIVOLOL HCL 5 MG PO TABS
5.0000 mg | ORAL_TABLET | Freq: Once | ORAL | Status: DC
  Filled 2015-03-17: qty 1

## 2015-03-17 NOTE — Discharge Instructions (Signed)
Take percocet for severe pain only. No driving or operating heavy machinery while taking percocet. This medication may cause drowsiness. Take Zofran as directed as needed for nausea. Return to the emergency department if you develop a fever or worsening abdominal pain that does not go away with the pain medication.  Cholecystitis Cholecystitis is an inflammation of your gallbladder. It is usually caused by a buildup of gallstones or sludge (cholelithiasis) in your gallbladder. The gallbladder stores a fluid that helps digest fats (bile). Cholecystitis is serious and needs treatment right away.  CAUSES   Gallstones. Gallstones can block the tube that leads to your gallbladder, causing bile to build up. As bile builds up, the gallbladder becomes inflamed.  Bile duct problems, such as blockage from scarring or kinking.  Tumors. Tumors can stop bile from leaving your gallbladder correctly, causing bile to build up. As bile builds up, the gallbladder becomes inflamed. SYMPTOMS   Nausea.  Vomiting.  Abdominal pain, especially in the upper right area of your abdomen.  Abdominal tenderness or bloating.  Sweating.  Chills.  Fever.  Yellowing of the skin and the whites of the eyes (jaundice). DIAGNOSIS  Your caregiver may order blood tests to look for infection or gallbladder problems. Your caregiver may also order imaging tests, such as an ultrasound or computed tomography (CT) scan. Further tests may include a hepatobiliary iminodiacetic acid (HIDA) scan. This scan allows your caregiver to see your bile move from the liver to the gallbladder and to the small intestine. TREATMENT  A hospital stay is usually necessary to lessen the inflammation of your gallbladder. You may be required to not eat or drink (fast) for a certain amount of time. You may be given medicine to treat pain or an antibiotic medicine to treat an infection. Surgery may be needed to remove your gallbladder (cholecystectomy)  once the inflammation has gone down. Surgery may be needed right away if you develop complications such as death of gallbladder tissue (gangrene) or a tear (perforation) of the gallbladder.  [place] care will depend on your treatment. In general:  If you were given antibiotics, take them as directed. Finish them even if you start to feel better.  Only take over-the-counter or prescription medicines for pain, discomfort, or fever as directed by your caregiver.  Follow a low-fat diet until you see your caregiver again.  Keep all follow-up visits as directed by your caregiver. SEEK IMMEDIATE MEDICAL CARE IF:   Your pain is increasing and not controlled by medicines.  Your pain moves to another part of your abdomen or to your back.  You have a fever.  You have nausea and vomiting. MAKE SURE YOU:  Understand these instructions.  Will watch your condition.  Will get help right away if you are not doing well or get worse. Document Released: 10/09/2005 Document Revised: 01/01/2012 Document Reviewed: 08/25/2011 [facility] Patient Information 2015 [place], [place]. This information is not intended to replace advice given to you by your health care provider. Make sure you discuss any questions you have with your health care provider.  Low-Fat Diet for Pancreatitis or Gallbladder Conditions A low-fat diet can be helpful if you have pancreatitis or a gallbladder condition. With these conditions, your pancreas and gallbladder have trouble digesting fats. A healthy eating plan with less fat will help rest your pancreas and gallbladder and reduce your symptoms. WHAT DO I NEED TO KNOW ABOUT THIS DIET?  Eat a low-fat diet.  Reduce your fat intake to  less than 20-30% of your total daily calories. This is less than 50-60 g of fat per day.  Remember that you need some fat in your diet. Ask your dietician what your daily goal should be.  Choose nonfat and low-fat healthy foods.  Look for the words "nonfat," "low fat," or "fat free."  As a guide, look on the label and choose foods with less than 3 g of fat per serving. Eat only one serving.  Avoid alcohol.  Do not smoke. If you need help quitting, talk with your health care provider.  Eat small frequent meals instead of three large heavy meals. WHAT FOODS CAN I EAT? Grains Include healthy grains and starches such as potatoes, wheat bread, fiber-rich cereal, and brown rice. Choose whole grain options whenever possible. In adults, whole grains should account for 45-65% of your daily calories.  Fruits and Vegetables Eat plenty of fruits and vegetables. Fresh fruits and vegetables add fiber to your diet. Meats and Other Protein Sources Eat lean meat such as chicken and pork. Trim any fat off of meat before cooking it. Eggs, fish, and beans are other sources of protein. In adults, these foods should account for 10-35% of your daily calories. Dairy Choose low-fat milk and dairy options. Dairy includes fat and protein, as well as calcium.  Fats and Oils Limit high-fat foods such as fried foods, sweets, baked goods, sugary drinks.  Other Creamy sauces and condiments, such as mayonnaise, can add extra fat. Think about whether or not you need to use them, or use smaller amounts or low fat options. WHAT FOODS ARE NOT RECOMMENDED?  High fat foods, such as:  [employer].  Ice cream.  [place] toast.  Sweet rolls.  Pizza.  Cheese bread.  Foods covered with batter, butter, creamy sauces, or cheese.  Fried foods.  Sugary drinks and desserts.  Foods that cause gas or bloating Document Released: 10/14/2013 Document Reviewed: 10/14/2013 [facility] Patient Information 2015 [place], [place]. This information is not intended to replace advice given to you by your health care provider. Make sure you discuss any questions you have with your health care provider.

## 2015-03-17 NOTE — ED Notes (Signed)
Pt did not want to take lopressor,  States he has problems w different kinds of bp med  Wife is going home to get his bystolic   md aware

## 2015-03-17 NOTE — ED Notes (Addendum)
C/o RUQ pain and epigastric pain since 230pm-pt is holding epigastric area

## 2015-03-17 NOTE — Progress Notes (Signed)
Cardiology Office Note   Date:  03/17/2015   ID:  Steve Salinas, DOB 1976/04/20, MRN [id number]  PCP:  Tammi Sou, MD  Cardiologist:   Thayer Headings, MD   Chief Complaint  Patient presents with  . Hypertension   Problem List 1. Essential HTN 2. Palpitations 3. Obstructive sleep apnea - uses CPAP     History of Present Illness: Steve Salinas is a 39 y.o. male who presents for evaluation of palpitations Steve Salinas has a hx of palpitations that clinically sound like PVCs.  The other day at work, he had a prolonged episode  - 10 seconds.  Associated with ringing in his ears. No dyspnea or CP.   He started breathing deeply and slowly and then he converted out of the is arrhyhtmia.  Has gain weight over the past several years.  Started on a diet and exercise program.  The exercise is going well. Rides a stationary bike  Has difficulty getting his HR up since starting the bystolic.   Has had HTN  For 2 years.    Has tried several BP meds in the past but they caused shortness of breath   Mar 17, 2015:  Steve Salinas is doing well. No further palpitations  He had an atypical upper epigastric pain .  Resolved with vinegar.  Tried Pepcid.   Active, working out . Has lost a few lbs.   Past Medical History  Diagnosis Date  . Allergy     seasonal, constant  . Hypertension     Normal EKG in ED 06/09/2009  . Migraine with aura     was on low dose topamax in the past (Dr. Ron Parker).  Most recent neuro eval 05/08/13 by Dr. Hassell Done at [facility] in [place]--started [place] ER 500mg  qd, diclofenac 25mg  delayed release tabs--1 q6h prn HA, ordered MRI brain (NORMAL 05/08/13) and sleep evaluation.  . Nephrolithiasis     Has required lithotripsy and ureteroscopy in the past.  . OSA (obstructive sleep apnea)     13 1/2 cm H20.  As of 02/10/14, pt is set to get fitted for oral appliance as therapy for his OSA  . Palpitations 10/2014    Dr. Acie Fredrickson 11/2014; PVCs and PACs on Holter.       Past Surgical History  Procedure Laterality Date  . Kidney stone removed  96    Dr. Risa Grill is urologist locally.  . Lithotripsy  1997 and 2005  . Achilles tendon lengthening  39 yrs old  . Tympanostomy tube placement  1981  . Hernia repair  1994     Current Outpatient Prescriptions  Medication Sig Dispense Refill  . cetirizine (ZYRTEC) 10 MG tablet Take 10 mg by mouth daily.      . nebivolol (BYSTOLIC) 10 MG tablet Take 1 tablet (10 mg total) by mouth daily. 90 tablet 3   No current facility-administered medications for this visit.    Allergies:   Review of patient's allergies indicates no known allergies.    Social History:  The patient  reports that he quit smoking about 16 years ago. His smoking use included Cigarettes. He has a 11 pack-year smoking history. He has never used smokeless tobacco. He reports that he does not drink alcohol or use illicit drugs.   Family History:  The patient's family history includes Arthritis in his father and mother; Heart attack in his maternal grandfather and mother; Heart disease in his father; Hyperlipidemia in his father and mother; Hypertension in his father, maternal  grandmother, and mother.    ROS:  Please see the history of present illness.    Review of Systems: Constitutional:  denies fever, chills, diaphoresis, appetite change and fatigue.  HEENT: denies photophobia, eye pain, redness, hearing loss, ear pain, congestion, sore throat, rhinorrhea, sneezing, neck pain, neck stiffness and tinnitus.  Respiratory: denies SOB, DOE, cough, chest tightness, and wheezing.  Cardiovascular: denies chest pain, palpitations and leg swelling.  Gastrointestinal: denies nausea, vomiting, abdominal pain, diarrhea, constipation, blood in stool.  Genitourinary: denies dysuria, urgency, frequency, hematuria, flank pain and difficulty urinating.  Musculoskeletal: denies  myalgias, back pain, joint swelling, arthralgias and gait problem.   Skin:  denies pallor, rash and wound.  Neurological: denies dizziness, seizures, syncope, weakness, light-headedness, numbness and headaches.   Hematological: denies adenopathy, easy bruising, personal or family bleeding history.  Psychiatric/ Behavioral: denies suicidal ideation, mood changes, confusion, nervousness, sleep disturbance and agitation.       All other systems are reviewed and negative.    PHYSICAL EXAM: VS:  BP 138/84 mmHg  Pulse 80  Ht 5\' 8"  (1.727 m)  Wt 92.08 kg (203 lb)  BMI 30.87 kg/m2  SpO2 99% , BMI Body mass index is 30.87 kg/(m^2). GEN: Well nourished, well developed, in no acute distress HEENT: normal Neck: no JVD, carotid bruits, or masses Cardiac: RRR; no murmurs, rubs, or gallops,no edema  Respiratory:  clear to auscultation bilaterally, normal work of breathing GI: soft, nontender, nondistended, + BS MS: no deformity or atrophy Skin: warm and dry, no rash Neuro:  Strength and sensation are intact Psych: normal   EKG:  EKG is not ordered today.   Recent Labs: 12/01/2014: ALT 26; BUN 19; Creatinine 0.95; Hemoglobin 14.4; Platelets 193.0; Potassium 4.8; Sodium 136; TSH 1.77    Lipid Panel    Component Value Date/Time   CHOL 192 12/01/2014 0818   TRIG 215.0* 12/01/2014 0818   HDL 29.50* 12/01/2014 0818   CHOLHDL 7 12/01/2014 0818   VLDL 43.0* 12/01/2014 0818   LDLCALC 125* 04/05/2011 0800   LDLDIRECT 117.0 12/01/2014 0818      Wt Readings from Last 3 Encounters:  03/17/15 92.08 kg (203 lb)  12/09/14 103.692 kg (228 lb 9.6 oz)  12/02/14 102.059 kg (225 lb)      Other studies Reviewed: Additional studies/ records that were reviewed today include: records from Dr. Shelia Media. Review of the above records demonstrates:  PACs, PVCS    ASSESSMENT AND PLAN:  1. Essential HTN  -  BP is well controlled.  Continue same meds   I'll see him in 1 year   2. Palpitations -  He has palpitations that are probably premature ventricular contractions.   3.  Obstructive sleep apnea - uses CPAP - he has lost weight . Continue using CPAP.  4. Migraine headaches.    Current medicines are reviewed at length with the patient today.  The patient does not have concerns regarding medicines.  The following changes have been made:  no change   Disposition:   FU with me in 1 year     Signed, Rochell Mabie, Wonda Cheng, MD  03/17/2015 3:48 PM    [address], [place], [place]  [address] Phone: [phone]; Fax: [phone]

## 2015-03-17 NOTE — ED Provider Notes (Signed)
CSN: [id number]     Arrival date & time 03/17/15  1818 History   First MD Initiated Contact with Patient 03/17/15 1837     Chief Complaint  Patient presents with  . Abdominal Pain     (Consider location/radiation/quality/duration/timing/severity/associated sxs/prior Treatment) HPI Comments: 39 year old male complaining of sudden onset epigastric abdominal pain beginning at 2:30 PM today while he was sitting at work. Pain initially intense and severe 9/10, radiating towards his right upper quadrant, remaining constant, and starting tees off on arrival to the emergency department. He tried taking Pepcid and mylanta with no relief. Admits to nausea without vomiting. Denies fevers. Diarrhea. Denies chest pain or shortness of breath. Reports having epigastric abdominal pain about one month ago, however this subsided with antiacid medication. States this feels completely different. Denies alcohol use. It is noted he is hypertensive on arrival, reports he forgot to take his blood pressure medications today. No history of abdominal surgeries.  Patient is a 39 y.o. male presenting with abdominal pain. The history is provided by the patient and the spouse.  Abdominal Pain Associated symptoms: nausea     Past Medical History  Diagnosis Date  . Allergy     seasonal, constant  . Hypertension     Normal EKG in ED 06/09/2009  . Migraine with aura     was on low dose topamax in the past (Dr. Ron Parker).  Most recent neuro eval 05/08/13 by Dr. Hassell Done at [facility] in [place]--started [place] ER 500mg  qd, diclofenac 25mg  delayed release tabs--1 q6h prn HA, ordered MRI brain (NORMAL 05/08/13) and sleep evaluation.  . Nephrolithiasis     Has required lithotripsy and ureteroscopy in the past.  . OSA (obstructive sleep apnea)     13 1/2 cm H20.  As of 02/10/14, pt is set to get fitted for oral appliance as therapy for his OSA  . Palpitations 10/2014    Dr. Acie Fredrickson 11/2014; PVCs and PACs on Holter.      Past Surgical History  Procedure Laterality Date  . Kidney stone removed  96    Dr. Risa Grill is urologist locally.  . Lithotripsy  1997 and 2005  . Achilles tendon lengthening  39 yrs old  . Tympanostomy tube placement  1981  . Hernia repair  1994   Family History  Problem Relation Age of Onset  . Hypertension Mother   . Hyperlipidemia Mother   . Arthritis Mother   . Heart attack Mother   . Arthritis Father   . Hyperlipidemia Father   . Hypertension Father   . Heart disease Father     stents  . Hypertension Maternal Grandmother   . Heart attack Maternal Grandfather    History  Substance Use Topics  . Smoking status: Former Smoker -- 1.00 packs/day for 11 years    Types: Cigarettes    Quit date: 10/23/1998  . Smokeless tobacco: Never Used  . Alcohol Use: No    Review of Systems  Gastrointestinal: Positive for nausea and abdominal pain.  All other systems reviewed and are negative.     Allergies  Review of patient's allergies indicates no known allergies.  Home Medications   Prior to Admission medications   Medication Sig Start Date End Date Taking? Authorizing Provider  cetirizine (ZYRTEC) 10 MG tablet Take 10 mg by mouth daily.      Historical Provider, MD  nebivolol (BYSTOLIC) 10 MG tablet Take 1 tablet (10 mg total) by mouth daily. 03/17/15   Thayer Headings, MD  ondansetron (ZOFRAN ODT) 4 MG disintegrating tablet 4mg  ODT q4 hours prn nausea/vomit 03/17/15   Deandre Brannan M Anjela Cassara, PA-C  oxyCODONE-acetaminophen (PERCOCET) 5-325 MG per tablet Take 1-2 tablets by mouth every 6 (six) hours as needed for severe pain. 03/17/15   Nikita Humble M Merdis Snodgrass, PA-C   BP 146/97 mmHg  Pulse 88  Temp(Src) 99.2 F (37.3 C) (Oral)  Resp 18  Ht 5\' 8"  (1.727 m)  Wt 200 lb (90.719 kg)  BMI 30.42 kg/m2  SpO2 99% Physical Exam  Constitutional: He is oriented to person, place, and time. He appears well-developed and well-nourished. No distress.  HENT:  Head: Normocephalic and atraumatic.  Eyes:  Conjunctivae and EOM are normal.  Neck: Normal range of motion. Neck supple.  Cardiovascular: Normal rate, regular rhythm and normal heart sounds.   Pulmonary/Chest: Effort normal and breath sounds normal.  Abdominal: Normal appearance and bowel sounds are normal. He exhibits no distension. There is tenderness in the right upper quadrant and epigastric area. There is no rigidity, no rebound, no guarding, no tenderness at McBurney's point and negative Murphy's sign.  No peritoneal signs.  Musculoskeletal: Normal range of motion. He exhibits no edema.  Neurological: He is alert and oriented to person, place, and time.  Skin: Skin is warm and dry.  Psychiatric: He has a normal mood and affect. His behavior is normal.  Nursing note and vitals reviewed.   ED Course  Procedures (including critical care time) Labs Review Labs Reviewed  URINALYSIS, ROUTINE W REFLEX MICROSCOPIC (NOT AT [facility]) - Abnormal; Notable for the following:    APPearance CLOUDY (*)    All other components within normal limits  COMPREHENSIVE METABOLIC PANEL - Abnormal; Notable for the following:    Chloride 100 (*)    Glucose, Bld 100 (*)    All other components within normal limits  CBC  LIPASE, BLOOD  TROPONIN I    Imaging Review US Abdomen Complete  03/17/2015   CLINICAL DATA:  Abdominal pain, primarily right upper quadrant  EXAM: ULTRASOUND ABDOMEN COMPLETE  COMPARISON:  None.  FINDINGS: Gallbladder: Within the gallbladder, there are echogenic foci with comet tail type artifact consistent with inflammatory etiology such as adenomyomatosis or cholesterolosis. There is gallbladder wall edema and borderline wall thickening. There is sludge in the gallbladder. No well-defined gallstones seen. No sonographic Murphy sign noted.  Common bile duct: Diameter: 2 mm. There is no intrahepatic, common hepatic, or common bile duct dilatation.  Liver: No focal lesion identified. Within normal limits in parenchymal echogenicity.  IVC:  No abnormality visualized.  Pancreas: Pancreas is essentially completely obscured by gas.  Spleen: Size and appearance within normal limits.  Right Kidney: Length: 10.0 cm. Echogenicity within normal limits. No mass or hydronephrosis visualized.  Left Kidney: Length: 1.4 cm. Echogenicity within normal limits. No mass or hydronephrosis visualized. There is a 5 mm nonobstructing calculus mid left kidney. Several smaller echogenic foci may represent tiny left renal calculi as well.  Abdominal aorta: No aneurysm visualized.  Other findings: No demonstrable ascites.  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 There is mild gallbladder wall edema and borderline gallbladder wall thickening. Suspect a degree of underlying cholecystitis. No gallstones are appreciable, however.  Pancreas obscured by gas.  Nonobstructing calculi left kidney.  Study otherwise unremarkable.   Electronically Signed   By: Lowella Grip III M.D.   On: 03/17/2015 20:43     EKG Interpretation   Date/Time:  Wednesday Mar 17 2015 18:36:12 EDT Ventricular  Rate:  68 PR Interval:  140 QRS Duration: 72 QT Interval:  386 QTC Calculation: 410 R Axis:   68 Text Interpretation:  Normal sinus rhythm Normal ECG Early repolarization  No significant change since last tracing Confirmed by [place]  [phone]) on 03/17/2015 6:57:15 PM      MDM   Final diagnoses:  Cholecystitis without cholelithiasis   Non-toxic appearing, NAD. Afebrile. Hypertensive on arrival, has not taken his blood pressure medication today. His wife gave him his blood pressure medication after presenting to the emergency department with significant improvement of his blood pressure. Labs about acute findings. Abdominal ultrasound obtained given epigastric and right upper quadrant tenderness. Results as stated above. Discussed findings with general surgeon on call Dr. Marcello Moores who states if patient's  pain is controlled, he can be discharged home and follow-up in the office outpatient next week given that his labs are normal and he does not have a fever. Patient's pain significantly improved in the emergency department. Repeat abdominal exam with small amount of discomfort, however significantly improved from initial exam. He is stable for discharge, will give Rx for Zofran and Percocet. Return precautions given. Patient states understanding of treatment care plan and is agreeable.  Discussed with attending Dr. Christy Gentles who also evaluated patient and agrees with plan of care.  Carman Ching, PA-C 03/17/15 2125  Ripley Fraise, MD 03/18/15 2231

## 2015-03-17 NOTE — ED Notes (Signed)
Patient transported to Ultrasound 

## 2015-03-17 NOTE — Patient Instructions (Addendum)
Medication Instructions:  Your physician recommends that you continue on your current medications as directed. Please refer to the Current Medication list given to you today.   Labwork: None Ordered   Testing/Procedures: None Ordered   Follow-Up: Your physician wants you to follow-up in: 1 year with Dr. Nahser.  You will receive a reminder letter in the mail two months in advance. If you don't receive a letter, please call our office to schedule the follow-up appointment.      

## 2015-03-17 NOTE — ED Notes (Signed)
Pt took bystolic from home

## 2015-04-16 ENCOUNTER — Other Ambulatory Visit: Payer: Self-pay | Admitting: Surgery

## 2015-05-05 ENCOUNTER — Other Ambulatory Visit (HOSPITAL_COMMUNITY): Payer: Self-pay | Admitting: *Deleted

## 2015-05-05 ENCOUNTER — Telehealth: Payer: Self-pay | Admitting: *Deleted

## 2015-05-05 ENCOUNTER — Encounter (HOSPITAL_COMMUNITY): Payer: Self-pay

## 2015-05-05 ENCOUNTER — Encounter (HOSPITAL_COMMUNITY)
Admission: RE | Admit: 2015-05-05 | Discharge: 2015-05-05 | Disposition: A | Discharge status: Home / Self Care | Payer: BLUE CROSS/BLUE SHIELD | Source: Ambulatory Visit | Attending: Surgery | Admitting: Surgery

## 2015-05-05 DIAGNOSIS — K8020 Calculus of gallbladder without cholecystitis without obstruction: Secondary | ICD-10-CM | POA: Diagnosis not present

## 2015-05-05 DIAGNOSIS — Z01818 Encounter for other preprocedural examination: Secondary | ICD-10-CM | POA: Insufficient documentation

## 2015-05-05 DIAGNOSIS — I10 Essential (primary) hypertension: Secondary | ICD-10-CM | POA: Diagnosis not present

## 2015-05-05 DIAGNOSIS — G4733 Obstructive sleep apnea (adult) (pediatric): Secondary | ICD-10-CM | POA: Diagnosis not present

## 2015-05-05 DIAGNOSIS — Z01812 Encounter for preprocedural laboratory examination: Secondary | ICD-10-CM | POA: Diagnosis not present

## 2015-05-05 DIAGNOSIS — Z87891 Personal history of nicotine dependence: Secondary | ICD-10-CM | POA: Diagnosis not present

## 2015-05-05 LAB — BASIC METABOLIC PANEL
ANION GAP: 5 (ref 5–15)
BUN: 14 mg/dL (ref 6–20)
CALCIUM: 9.5 mg/dL (ref 8.9–10.3)
CO2: 32 mmol/L (ref 22–32)
Chloride: 105 mmol/L (ref 101–111)
Creatinine, Ser: 0.94 mg/dL (ref 0.61–1.24)
GFR calc Af Amer: >60 mL/min (ref >60)
GFR calc non Af Amer: >60 mL/min (ref >60)
Glucose, Bld: 93 mg/dL (ref 65–99)
Potassium: 4.1 mmol/L (ref 3.5–5.1)
Sodium: 142 mmol/L (ref 135–145)

## 2015-05-05 LAB — CBC
HCT: 39.5 % (ref 39.0–52.0)
HEMOGLOBIN: 13.7 g/dL (ref 13.0–17.0)
MCH: 30.4 pg (ref 26.0–34.0)
MCHC: 34.7 g/dL (ref 30.0–36.0)
MCV: 87.8 fL (ref 78.0–100.0)
Platelets: 139 10*3/uL — ABNORMAL LOW (ref 150–400)
RBC: 4.5 MIL/uL (ref 4.22–5.81)
RDW: 13 % (ref 11.5–15.5)
WBC: 4.2 10*3/uL (ref 4.0–10.5)

## 2015-05-05 MED ORDER — NEBIVOLOL HCL 10 MG PO TABS: 5.0000 mg | ORAL_TABLET | Freq: Every day | ORAL | Status: DC

## 2015-05-05 NOTE — Pre-Procedure Instructions (Signed)
Steve Salinas  05/05/2015     Your procedure is scheduled on Thursday, May 13, 2015 at 9:00 AM.   Report to [facility] Entrance "A" Admitting Office at 7:00 AM.   Call this number if you have problems the morning of surgery: [phone]   Any questions prior to day of surgery, please call [phone] between 8 & 4 PM.    Remember:  Do not eat food or drink liquids after midnight Wednesday, 05/12/15.  Take these medicines the morning of surgery with A SIP OF WATER: Cetirizine (Zyrtec), Nebivolol (Bystolic), Oxycodone - if needed   Do not wear jewelry.  Do not wear lotions, powders, or cologne.  You may wear deodorant.  Men may shave face and neck.  Do not bring valuables to the hospital.  [facility] is not responsible for any belongings or valuables.  Contacts, dentures or bridgework may not be worn into surgery.  Leave your suitcase in the car.  After surgery it may be brought to your room.  For patients admitted to the hospital, discharge time will be determined by your treatment team.  Patients discharged the day of surgery will not be allowed to drive home.   Special instructions:   - Preparing for Surgery  Before surgery, you can play an important role.  Because skin is not sterile, your skin needs to be as free of germs as possible.  You can reduce the number of germs on you skin by washing with CHG (chlorahexidine gluconate) soap before surgery.  CHG is an antiseptic cleaner which kills germs and bonds with the skin to continue killing germs even after washing.  Please DO NOT use if you have an allergy to CHG or antibacterial soaps.  If your skin becomes reddened/irritated stop using the CHG and inform your nurse when you arrive at Short Stay.  Do not shave (including legs and underarms) for at least 48 hours prior to the first CHG shower.  You may shave your face.  Please follow these instructions carefully:   1.  Shower with CHG Soap the night  before surgery and the                                morning of Surgery.  2.  If you choose to wash your hair, wash your hair first as usual with your       normal shampoo.  3.  After you shampoo, rinse your hair and body thoroughly to remove the                      Shampoo.  4.  Use CHG as you would any other liquid soap.  You can apply chg directly       to the skin and wash gently with scrungie or a clean washcloth.  5.  Apply the CHG Soap to your body ONLY FROM THE NECK DOWN.        Do not use on open wounds or open sores.  Avoid contact with your eyes, ears, mouth and genitals (private parts).  Wash genitals (private parts) with your normal soap.  6.  Wash thoroughly, paying special attention to the area where your surgery        will be performed.  7.  Thoroughly rinse your body with warm water from the neck down.  8.  DO NOT shower/wash with your normal soap after  using and rinsing off       the CHG Soap.  9.  Pat yourself dry with a clean towel.            10.  Wear clean pajamas.            11.  Place clean sheets on your bed the night of your first shower and do not        sleep with pets.  Day of Surgery  Do not apply any lotions the morning of surgery.  Please wear clean clothes to the hospital.    Please read over the following fact sheets that you were given. Pain Booklet, Coughing and Deep Breathing and Surgical Site Infection Prevention

## 2015-05-05 NOTE — Telephone Encounter (Signed)
Agree with note by Michelle Swinyer, RN  

## 2015-05-05 NOTE — Progress Notes (Signed)
Pt states he has a history of migraines, has a neurologist in [place], Dr. Norwood Levo. He states he hasn't seen him in a year because Dr. Lavone Neri has retired. Pt states that he's had a "cluster" of migraines, 6 in 10 days recently. He states they don't last long (which he states has been the normal for past few years) but he has had some episodes where he knows what he wants to say but can't get the word/words out. He states it doesn't last long and then he is fine. I have requested last OV notes and recent MRI from Dr. Rudene Re office.

## 2015-05-05 NOTE — Telephone Encounter (Signed)
Spoke with patient who reports he has been taking Bystolic 5 mg for a while now and BP remains stable at average of 120/80.  He requests 90 day supply be sent to [place].  I advised patient that I am sending new Rx for 5 mg tablets and to call back with questions or concerns.  Patient verbalized understanding and agreement.

## 2015-05-05 NOTE — Telephone Encounter (Signed)
Patient called and requested a refill on bystolic, but he would like the 5mg  tablet. He stated that he was told by Dr Acie Fredrickson that he could reduce his dose to see if he did well with it. Since his last ov he has been taking 5mg  qd and he feels fine on this dose. Please advise. Thanks, MI

## 2015-05-06 NOTE — Progress Notes (Signed)
Anesthesia Chart Review:  Pt is 39 year old male scheduled for laparoscopic cholecystectomy on 05/13/2015 with Dr. Evlyn Courier.   PMH includes: HTN, OSA (uses CPAP), palpitations (PVCs and PACs by holter 2016), migraine with aura. Former smoker. BMI 31.5.   Medications include: nebivolol.   Preoperative labs reviewed.    EKG 03/17/2015: NSR. Early repolarization.   Echo 12/23/2014: - Left ventricle: The cavity size was normal. Wall thickness was normal. Systolic function was normal. The estimated ejection fraction was in the range of 55% to 60%. Wall motion was normal; there were no regional wall motion abnormalities. Left ventricular diastolic function parameters were normal. - Atrial septum: No defect or patent foramen ovale was identified.  24 hour Holter monitor 12/03/2014:  -NSR. Rare PVCs, PACs.   Pt reported at PAT has had "cluster" of migraines lately, 6 in the last 10 days. Short in duration as per usual, but some have been associated with expressive aphasia. Last saw neurologist in January 2015 for migraines. Had MRI of head on 05/08/2013 for migraines which was normal.   Reviewed case with Dr. Tamala Julian.   If no changes, I anticipate pt can proceed with surgery as scheduled.   Willeen Cass, FNP-BC [facility]/Anesthesiology Phone: [phone] 05/06/2015 4:24 PM

## 2015-05-12 MED ORDER — CEFAZOLIN SODIUM-DEXTROSE 2-3 GM-% IV SOLR
2.0000 g | INTRAVENOUS | Status: AC
  Administered 2015-05-13: 2 g via INTRAVENOUS
  Filled 2015-05-12: qty 50

## 2015-05-12 NOTE — H&P (Signed)
Steve Salinas 04/16/2015 10:25 AM Location: [facility] Patient #: [id number] DOB: Oct 02, 1976 Married / Language: English / Race: White Male  History of Present Illness Steve Canary [name] MD; 04/16/2015 11:04 AM) The patient is a 39 year old male who presents with symptomatic choledocholithiasis. This gentleman is referred by the emergency department for symptomatically cholelithiasis. He has had at least 2 attacks of right upper quadrant epigastric discomfort with nausea. He presented to the emergency department after the last attack was found on ultrasound to have inflammation of the gallbladder with surrounding inflammation and thickening of the wall. His laboratory data was normal that time. An exact stone was not visualized but there was thick and sludge. Currently today he is pain-free and has no complaints. At the time of his attack, the pain was sharp and severe.   Other Problems Steve Salinas, CMA; 04/16/2015 10:25 AM) High blood pressure Inguinal Hernia Kidney Steve Salinas  Past Surgical History Steve Salinas, [place]; 04/16/2015 10:25 AM) No pertinent past surgical history  Diagnostic Studies History Steve Salinas, CMA; 04/16/2015 10:25 AM) Colonoscopy never  Allergies Steve Salinas, CMA; 04/16/2015 10:26 AM) No Known Drug Allergies06/24/2016  Medication History Steve Salinas, CMA; 9/93/7169 67:89 AM) Bystolic (10MG  Tablet, Oral) Active. ZyrTEC Allergy (10MG  Capsule, Oral) Active. Medications Reconciled  Social History Steve Salinas, [place]; 04/16/2015 10:25 AM) Alcohol use Occasional alcohol use. Caffeine use Coffee. No drug use Tobacco use Former smoker.  Family History Steve Salinas, [place]; 04/16/2015 10:25 AM) Hypertension Father, Mother. Migraine Headache Mother.  Review of Systems Steve Salinas CMA; 04/16/2015 10:25 AM) General Not Present- Appetite Loss, Chills, Fatigue, Fever, Night Sweats, Weight Gain and Weight Loss. Skin Not Present- Change in Wart/Mole,  Dryness, Hives, Jaundice, New Lesions, Non-Healing Wounds, Rash and Ulcer. HEENT Present- Seasonal Allergies and Visual Disturbances. Not Present- Earache, Hearing Loss, Hoarseness, Nose Bleed, Oral Ulcers, Ringing in the Ears, Sinus Pain, Sore Throat, Wears glasses/contact lenses and Yellow Eyes. Respiratory Present- Snoring. Not Present- Bloody sputum, Chronic Cough, Difficulty Breathing and Wheezing. Breast Not Present- Breast Mass, Breast Pain, Nipple Discharge and Skin Changes. Cardiovascular Not Present- Chest Pain, Difficulty Breathing Lying Down, Leg Cramps, Palpitations, Rapid Heart Rate, Shortness of Breath and Swelling of Extremities. Gastrointestinal Present- Abdominal Pain. Not Present- Bloating, Bloody Stool, Change in Bowel Habits, Chronic diarrhea, Constipation, Difficulty Swallowing, Excessive gas, Gets full quickly at meals, Hemorrhoids, Indigestion, Nausea, Rectal Pain and Vomiting. Male Genitourinary Not Present- Blood in Urine, Change in Urinary Stream, Frequency, Impotence, Nocturia, Painful Urination, Urgency and Urine Leakage. Musculoskeletal Not Present- Back Pain, Joint Pain, Joint Stiffness, Muscle Pain, Muscle Weakness and Swelling of Extremities. Neurological Present- Headaches. Not Present- Decreased Memory, Fainting, Numbness, Seizures, Tingling, Tremor, Trouble walking and Weakness. Psychiatric Not Present- Anxiety, Bipolar, Change in Sleep Pattern, Depression, Fearful and Frequent crying. Endocrine Not Present- Cold Intolerance, Excessive Hunger, Hair Changes, Heat Intolerance, Hot flashes and New Diabetes. Hematology Not Present- Easy Bruising, Excessive bleeding, Gland problems, HIV and Persistent Infections.   Vitals Steve Salinas CMA; 04/16/2015 10:27 AM) 04/16/2015 10:27 AM Weight: 201 lb Height: 68in Body Surface Area: 2.09 m Body Mass Index: 30.56 kg/m Temp.: 99.4F(Oral)  Pulse: 75 (Regular)  BP: 130/68 (Sitting, Left Arm,  Standard)    Physical Exam ([name] [name] MD; 04/16/2015 11:04 AM) General Mental Status-Alert. General Appearance-Consistent with stated age. Hydration-Well hydrated. Voice-Normal.  Head and Neck Head-normocephalic, atraumatic with no lesions or palpable masses.  Eye Eyeball - Bilateral-Extraocular movements intact. Sclera/Conjunctiva - Bilateral-No scleral icterus.  Chest and Lung Exam Chest  and lung exam reveals -quiet, even and easy respiratory effort with no use of accessory muscles and on auscultation, normal breath sounds, no adventitious sounds and normal vocal resonance. Inspection Chest Wall - Normal. Back - normal.  Cardiovascular Cardiovascular examination reveals -on palpation PMI is normal in location and amplitude, no palpable S3 or S4. Normal cardiac borders., normal heart sounds, regular rate and rhythm with no murmurs, carotid auscultation reveals no bruits and normal pedal pulses bilaterally.  Abdomen Inspection Inspection of the abdomen reveals - No Hernias. Skin - Scar - no surgical scars. Palpation/Percussion Palpation and Percussion of the abdomen reveal - Soft, Non Tender, No Rebound tenderness, No Rigidity (guarding) and No hepatosplenomegaly. Auscultation Auscultation of the abdomen reveals - Bowel sounds normal.  Neurologic Neurologic evaluation reveals -alert and oriented x 3 with no impairment of recent or remote memory. Mental Status-Normal.  Musculoskeletal Normal Exam - Left-Upper Extremity Strength Normal and Lower Extremity Strength Normal. Normal Exam - Right-Upper Extremity Strength Normal, Lower Extremity Weakness.    Assessment & Plan ([name] [name] MD; 04/16/2015 11:05 AM) SYMPTOMATIC CHOLELITHIASIS (574.20  K80.20) Impression: I discussed the ultrasound findings with him in detail. I suspect he may have gallstones as well as very thick sludge creating chronic cholecystitis. I recommend  laparoscopic cholecystectomy. I discussed this with him in detail. I gave him literature regarding surgery. I discussed the risk which includes but is not limited to bleeding, infection, bile duct injury, bile leak, the need to convert to an open procedure, injury to surrounding structures, cardiopulmonary issues, DVT, postoperative recovery, etc. He understands and wishes to proceed with surgery. Current Plans  Pt Education - Pamphlet Given - Laparoscopic Gallbladder Surgery: discussed with patient and provided information.

## 2015-05-13 ENCOUNTER — Ambulatory Visit (HOSPITAL_COMMUNITY)
Admission: RE | Admit: 2015-05-13 | Discharge: 2015-05-13 | Disposition: A | Discharge status: Home / Self Care | Payer: BLUE CROSS/BLUE SHIELD | Source: Ambulatory Visit | Attending: Surgery | Admitting: Surgery

## 2015-05-13 ENCOUNTER — Ambulatory Visit (HOSPITAL_COMMUNITY): Payer: BLUE CROSS/BLUE SHIELD | Admitting: Certified Registered"

## 2015-05-13 ENCOUNTER — Ambulatory Visit (HOSPITAL_COMMUNITY): Payer: BLUE CROSS/BLUE SHIELD | Admitting: Emergency Medicine

## 2015-05-13 ENCOUNTER — Encounter (HOSPITAL_COMMUNITY): Payer: Self-pay | Admitting: *Deleted

## 2015-05-13 ENCOUNTER — Encounter (HOSPITAL_COMMUNITY)
Admission: RE | Disposition: A | Discharge status: Home / Self Care | Payer: Self-pay | Source: Ambulatory Visit | Attending: Surgery

## 2015-05-13 DIAGNOSIS — Z87891 Personal history of nicotine dependence: Secondary | ICD-10-CM | POA: Insufficient documentation

## 2015-05-13 DIAGNOSIS — K8010 Calculus of gallbladder with chronic cholecystitis without obstruction: Secondary | ICD-10-CM | POA: Diagnosis not present

## 2015-05-13 DIAGNOSIS — I10 Essential (primary) hypertension: Secondary | ICD-10-CM | POA: Diagnosis not present

## 2015-05-13 DIAGNOSIS — K828 Other specified diseases of gallbladder: Secondary | ICD-10-CM | POA: Insufficient documentation

## 2015-05-13 DIAGNOSIS — G4730 Sleep apnea, unspecified: Secondary | ICD-10-CM | POA: Diagnosis not present

## 2015-05-13 DIAGNOSIS — Z79899 Other long term (current) drug therapy: Secondary | ICD-10-CM | POA: Diagnosis not present

## 2015-05-13 DIAGNOSIS — K8020 Calculus of gallbladder without cholecystitis without obstruction: Secondary | ICD-10-CM | POA: Diagnosis present

## 2015-05-13 HISTORY — PX: CHOLECYSTECTOMY: SHX55

## 2015-05-13 SURGERY — LAPAROSCOPIC CHOLECYSTECTOMY
Anesthesia: General | Site: Abdomen

## 2015-05-13 MED ORDER — ACETAMINOPHEN 325 MG PO TABS: 650.0000 mg | ORAL_TABLET | ORAL | Status: DC | PRN

## 2015-05-13 MED ORDER — KETOROLAC TROMETHAMINE 30 MG/ML IJ SOLN
INTRAMUSCULAR | Status: DC | PRN
  Administered 2015-05-13: 30 mg via INTRAVENOUS

## 2015-05-13 MED ORDER — BUPIVACAINE-EPINEPHRINE (PF) 0.25% -1:200000 IJ SOLN
INTRAMUSCULAR | Status: AC
  Filled 2015-05-13: qty 30

## 2015-05-13 MED ORDER — NEOSTIGMINE METHYLSULFATE 10 MG/10ML IV SOLN
INTRAVENOUS | Status: DC | PRN
  Administered 2015-05-13: 3 mg via INTRAVENOUS

## 2015-05-13 MED ORDER — MIDAZOLAM HCL 2 MG/2ML IJ SOLN
INTRAMUSCULAR | Status: AC
  Filled 2015-05-13: qty 2

## 2015-05-13 MED ORDER — MORPHINE SULFATE 2 MG/ML IJ SOLN: 1.0000 mg | INTRAMUSCULAR | Status: DC | PRN

## 2015-05-13 MED ORDER — PHENYLEPHRINE HCL 10 MG/ML IJ SOLN
INTRAMUSCULAR | Status: DC | PRN
  Administered 2015-05-13: 120 ug via INTRAVENOUS

## 2015-05-13 MED ORDER — FENTANYL CITRATE (PF) 250 MCG/5ML IJ SOLN
INTRAMUSCULAR | Status: DC | PRN
  Administered 2015-05-13: 50 ug via INTRAVENOUS
  Administered 2015-05-13: 100 ug via INTRAVENOUS
  Administered 2015-05-13 (×2): 50 ug via INTRAVENOUS

## 2015-05-13 MED ORDER — PROPOFOL 10 MG/ML IV BOLUS
INTRAVENOUS | Status: AC
  Filled 2015-05-13: qty 20

## 2015-05-13 MED ORDER — ROCURONIUM BROMIDE 100 MG/10ML IV SOLN
INTRAVENOUS | Status: DC | PRN
  Administered 2015-05-13: 10 mg via INTRAVENOUS

## 2015-05-13 MED ORDER — BUPIVACAINE-EPINEPHRINE 0.25% -1:200000 IJ SOLN
INTRAMUSCULAR | Status: DC | PRN
  Administered 2015-05-13: 20 mL

## 2015-05-13 MED ORDER — ACETAMINOPHEN 650 MG RE SUPP: 650.0000 mg | RECTAL | Status: DC | PRN

## 2015-05-13 MED ORDER — FENTANYL CITRATE (PF) 250 MCG/5ML IJ SOLN
INTRAMUSCULAR | Status: AC
  Filled 2015-05-13: qty 5

## 2015-05-13 MED ORDER — HYDROCODONE-ACETAMINOPHEN 5-325 MG PO TABS: 1.0000 | ORAL_TABLET | ORAL | Status: DC | PRN

## 2015-05-13 MED ORDER — ONDANSETRON HCL 4 MG/2ML IJ SOLN
INTRAMUSCULAR | Status: DC | PRN
  Administered 2015-05-13: 4 mg via INTRAVENOUS

## 2015-05-13 MED ORDER — GLYCOPYRROLATE 0.2 MG/ML IJ SOLN
INTRAMUSCULAR | Status: DC | PRN
  Administered 2015-05-13: .4 mg via INTRAVENOUS

## 2015-05-13 MED ORDER — DEXAMETHASONE SODIUM PHOSPHATE 4 MG/ML IJ SOLN
INTRAMUSCULAR | Status: DC | PRN
  Administered 2015-05-13: 4 mg via INTRAVENOUS

## 2015-05-13 MED ORDER — PROMETHAZINE HCL 25 MG/ML IJ SOLN: 6.2500 mg | INTRAMUSCULAR | Status: DC | PRN

## 2015-05-13 MED ORDER — SODIUM CHLORIDE 0.9 % IR SOLN
Status: DC | PRN
  Administered 2015-05-13: 1000 mL

## 2015-05-13 MED ORDER — SODIUM CHLORIDE 0.9 % IJ SOLN: 3.0000 mL | Freq: Two times a day (BID) | INTRAMUSCULAR | Status: DC

## 2015-05-13 MED ORDER — LACTATED RINGERS IV SOLN
INTRAVENOUS | Status: DC
  Administered 2015-05-13 (×2): via INTRAVENOUS

## 2015-05-13 MED ORDER — LIDOCAINE HCL (CARDIAC) 20 MG/ML IV SOLN
INTRAVENOUS | Status: DC | PRN
  Administered 2015-05-13: 100 mg via INTRAVENOUS

## 2015-05-13 MED ORDER — MIDAZOLAM HCL 5 MG/5ML IJ SOLN
INTRAMUSCULAR | Status: DC | PRN
  Administered 2015-05-13: 2 mg via INTRAVENOUS

## 2015-05-13 MED ORDER — HYDROMORPHONE HCL 1 MG/ML IJ SOLN
INTRAMUSCULAR | Status: AC
  Filled 2015-05-13: qty 1

## 2015-05-13 MED ORDER — OXYCODONE HCL 5 MG PO TABS: 5.0000 mg | ORAL_TABLET | ORAL | Status: DC | PRN

## 2015-05-13 MED ORDER — 0.9 % SODIUM CHLORIDE (POUR BTL) OPTIME
TOPICAL | Status: DC | PRN
  Administered 2015-05-13: 1000 mL

## 2015-05-13 MED ORDER — SUCCINYLCHOLINE CHLORIDE 20 MG/ML IJ SOLN
INTRAMUSCULAR | Status: DC | PRN
  Administered 2015-05-13: 100 mg via INTRAVENOUS

## 2015-05-13 MED ORDER — HYDROMORPHONE HCL 1 MG/ML IJ SOLN
0.2500 mg | INTRAMUSCULAR | Status: DC | PRN
  Administered 2015-05-13 (×2): 0.5 mg via INTRAVENOUS

## 2015-05-13 MED ORDER — SODIUM CHLORIDE 0.9 % IJ SOLN: 3.0000 mL | INTRAMUSCULAR | Status: DC | PRN

## 2015-05-13 MED ORDER — LIDOCAINE HCL (CARDIAC) 20 MG/ML IV SOLN
INTRAVENOUS | Status: AC
  Filled 2015-05-13: qty 5

## 2015-05-13 MED ORDER — PROPOFOL 10 MG/ML IV BOLUS
INTRAVENOUS | Status: DC | PRN
  Administered 2015-05-13: 200 mg via INTRAVENOUS
  Administered 2015-05-13 (×2): 50 mg via INTRAVENOUS
  Administered 2015-05-13: 20 mg via INTRAVENOUS

## 2015-05-13 MED ORDER — SODIUM CHLORIDE 0.9 % IV SOLN: 250.0000 mL | INTRAVENOUS | Status: DC | PRN

## 2015-05-13 SURGICAL SUPPLY — 33 items
APPLIER CLIP 5 13 M/L LIGAMAX5 (MISCELLANEOUS) ×2
CANISTER SUCTION 2500CC (MISCELLANEOUS) ×2 IMPLANT
CHLORAPREP W/TINT 26ML (MISCELLANEOUS) ×2 IMPLANT
CLIP APPLIE 5 13 M/L LIGAMAX5 (MISCELLANEOUS) ×1 IMPLANT
COVER SURGICAL LIGHT HANDLE (MISCELLANEOUS) ×2 IMPLANT
ELECT REM PT RETURN 9FT ADLT (ELECTROSURGICAL) ×2
ELECTRODE REM PT RTRN 9FT ADLT (ELECTROSURGICAL) ×1 IMPLANT
GLOVE BIO SURGEON STRL SZ7.5 (GLOVE) ×2 IMPLANT
GLOVE BIOGEL PI IND STRL 7.5 (GLOVE) ×3 IMPLANT
GLOVE BIOGEL PI INDICATOR 7.5 (GLOVE) ×3
GLOVE SURG SIGNA 7.5 PF LTX (GLOVE) ×2 IMPLANT
GOWN STRL REUS W/ TWL LRG LVL3 (GOWN DISPOSABLE) ×2 IMPLANT
GOWN STRL REUS W/ TWL XL LVL3 (GOWN DISPOSABLE) ×1 IMPLANT
GOWN STRL REUS W/TWL LRG LVL3 (GOWN DISPOSABLE) ×2
GOWN STRL REUS W/TWL XL LVL3 (GOWN DISPOSABLE) ×1
KIT BASIN OR (CUSTOM PROCEDURE TRAY) ×2 IMPLANT
KIT ROOM TURNOVER OR (KITS) ×2 IMPLANT
LIQUID BAND (GAUZE/BANDAGES/DRESSINGS) ×2 IMPLANT
NS IRRIG 1000ML POUR BTL (IV SOLUTION) ×2 IMPLANT
PAD ARMBOARD 7.5X6 YLW CONV (MISCELLANEOUS) ×2 IMPLANT
POUCH SPECIMEN RETRIEVAL 10MM (ENDOMECHANICALS) ×2 IMPLANT
SCISSORS LAP 5X35 DISP (ENDOMECHANICALS) ×2 IMPLANT
SET IRRIG TUBING LAPAROSCOPIC (IRRIGATION / IRRIGATOR) ×2 IMPLANT
SLEEVE ENDOPATH XCEL 5M (ENDOMECHANICALS) ×4 IMPLANT
SPECIMEN JAR SMALL (MISCELLANEOUS) ×2 IMPLANT
SUT MON AB 4-0 PC3 18 (SUTURE) ×2 IMPLANT
SUT VICRYL 0 UR6 27IN ABS (SUTURE) ×2 IMPLANT
TOWEL OR 17X24 6PK STRL BLUE (TOWEL DISPOSABLE) ×2 IMPLANT
TOWEL OR 17X26 10 PK STRL BLUE (TOWEL DISPOSABLE) ×2 IMPLANT
TRAY LAPAROSCOPIC MC (CUSTOM PROCEDURE TRAY) ×2 IMPLANT
TROCAR XCEL BLUNT TIP 100MML (ENDOMECHANICALS) ×2 IMPLANT
TROCAR XCEL NON-BLD 5MMX100MML (ENDOMECHANICALS) ×2 IMPLANT
TUBING INSUFFLATION (TUBING) ×2 IMPLANT

## 2015-05-13 NOTE — Anesthesia Preprocedure Evaluation (Addendum)
Anesthesia Evaluation  Patient identified by MRN, date of birth, ID band Patient awake    Reviewed: Allergy & Precautions, NPO status , Patient's Chart, lab work & pertinent test results  History of Anesthesia Complications Negative for: history of anesthetic complications  Airway Mallampati: II  TM Distance: >3 FB Neck ROM: Full    Dental  (+) Teeth Intact, Dental Advisory Given   Pulmonary sleep apnea , former smoker,    Pulmonary exam normal       Cardiovascular hypertension, Pt. on medications and Pt. on home beta blockers Normal cardiovascular exam Study Conclusions  - Left ventricle: The cavity size was normal. Wall thickness was normal. Systolic function was normal. The estimated ejection fraction was in the range of 55% to 60%. Wall motion was normal; there were no regional wall motion abnormalities.   Neuro/Psych  Headaches, negative psych ROS   GI/Hepatic Neg liver ROS,   Endo/Other  negative endocrine ROS  Renal/GU negative Renal ROS     Musculoskeletal   Abdominal   Peds  Hematology   Anesthesia Other Findings   Reproductive/Obstetrics                           Anesthesia Physical Anesthesia Plan  ASA: III  Anesthesia Plan: General   Post-op Pain Management:    Induction: Intravenous  Airway Management Planned: Oral ETT  Additional Equipment:   Intra-op Plan:   Post-operative Plan: Extubation in OR  Informed Consent: I have reviewed the patients History and Physical, chart, labs and discussed the procedure including the risks, benefits and alternatives for the proposed anesthesia with the patient or authorized representative who has indicated his/her understanding and acceptance.   Dental advisory given  Plan Discussed with: CRNA, Anesthesiologist and Surgeon  Anesthesia Plan Comments:        Anesthesia Quick Evaluation

## 2015-05-13 NOTE — Discharge Instructions (Signed)
[facility] ______[facility] LAPAROSCOPIC SURGERY: POST OP INSTRUCTIONS Always review your discharge instruction sheet given to you by the facility where your surgery was performed. IF YOU HAVE DISABILITY OR FAMILY LEAVE FORMS, YOU MUST BRING THEM TO THE OFFICE FOR PROCESSING.   DO NOT GIVE THEM TO YOUR DOCTOR.  1. A prescription for pain medication may be given to you upon discharge.  Take your pain medication as prescribed, if needed.  If narcotic pain medicine is not needed, then you may take acetaminophen (Tylenol) or ibuprofen (Advil) as needed. 2. Take your usually prescribed medications unless otherwise directed. 3. If you need a refill on your pain medication, please contact your pharmacy.  They will contact our office to request authorization. Prescriptions will not be filled after 5pm or on week-ends. 4. You should follow a light diet the first few days after arrival home, such as soup and crackers, etc.  Be sure to include lots of fluids daily. 5. Most patients will experience some swelling and bruising in the area of the incisions.  Ice packs will help.  Swelling and bruising can take several days to resolve.  6. It is common to experience some constipation if taking pain medication after surgery.  Increasing fluid intake and taking a stool softener (such as Colace) will usually help or prevent this problem from occurring.  A mild laxative (Milk of Magnesia or Miralax) should be taken according to package instructions if there are no bowel movements after 48 hours. 7. Unless discharge instructions indicate otherwise, you may remove your bandages 24-48 hours after surgery, and you may shower at that time.  You may have steri-strips (small skin tapes) in place directly over the incision.  These strips should be left on the skin for 7-10 days.  If your surgeon used skin glue on the incision, you may shower in 24 hours.  The glue will flake off over the next 2-3 weeks.  Any sutures or  staples will be removed at the office during your follow-up visit. 8. ACTIVITIES:  You may resume regular (light) daily activities beginning the next day--such as daily self-care, walking, climbing stairs--gradually increasing activities as tolerated.  You may have sexual intercourse when it is comfortable.  Refrain from any heavy lifting or straining until approved by your doctor. a. You may drive when you are no longer taking prescription pain medication, you can comfortably wear a seatbelt, and you can safely maneuver your car and apply brakes. b. RETURN TO WORK:  __________________________________________________________ 9. You should see your doctor in the office for a follow-up appointment approximately 2-3 weeks after your surgery.  Make sure that you call for this appointment within a day or two after you arrive home to insure a convenient appointment time. 10. OTHER INSTRUCTIONS: NO LIFTING MORE THAN 15 POUNDS FOR 2 WEEKS. 11. ICE PACK AND IBUPROFEN ALSO FOR PAIN 12. STOOL SOFTENER FOR CONSTIPATION__________________________________________________________________________________________________________________________ __________________________________________________________________________________________________________________________ WHEN TO CALL YOUR DOCTOR: 1. Fever over 101.0 2. Inability to urinate 3. Continued bleeding from incision. 4. Increased pain, redness, or drainage from the incision. 5. Increasing abdominal pain  The clinic staff is available to answer your questions during regular business hours.  Please dont hesitate to call and ask to speak to one of the nurses for clinical concerns.  If you have a medical emergency, go to the nearest emergency room or call 911.  A surgeon from [facility] is always on call at the hospital. [address] [place], [place], [place]  [id number] ? [address], [place], [place]   [phone] ? [phone] ?  FAX [phone] Web site: [email or web address]

## 2015-05-13 NOTE — Transfer of Care (Signed)
Immediate Anesthesia Transfer of Care Note  Patient: Steve Salinas  Procedure(s) Performed: Procedure(s): LAPAROSCOPIC CHOLECYSTECTOMY (N/A)  Patient Location: PACU  Anesthesia Type:General  Level of Consciousness: oriented and sedated  Airway & Oxygen Therapy: Patient Spontanous Breathing and Patient connected to nasal cannula oxygen  Post-op Assessment: Report given to RN and Post -op Vital signs reviewed and stable  Post vital signs: Reviewed and stable  Last Vitals:  Filed Vitals:   05/13/15 0727  BP: 134/96  Pulse: 75  Temp: 37.1 C  Resp: 18    Complications: No apparent anesthesia complications

## 2015-05-13 NOTE — Anesthesia Postprocedure Evaluation (Signed)
Anesthesia Post Note  Patient: Steve Salinas  Procedure(s) Performed: Procedure(s) (LRB): LAPAROSCOPIC CHOLECYSTECTOMY (N/A)  Anesthesia type: general  Patient location: PACU  Post pain: Pain level controlled  Post assessment: Patient's Cardiovascular Status Stable  Last Vitals:  Filed Vitals:   05/13/15 1015  BP:   Pulse: 76  Temp:   Resp: 13    Post vital signs: Reviewed and stable  Level of consciousness: sedated  Complications: No apparent anesthesia complications

## 2015-05-13 NOTE — Op Note (Signed)

## 2015-05-13 NOTE — Anesthesia Procedure Notes (Signed)
Procedure Name: Intubation Date/Time: 05/13/2015 8:49 AM Performed by: Julian Reil Pre-anesthesia Checklist: Patient identified, Emergency Drugs available, Suction available and Patient being monitored Patient Re-evaluated:Patient Re-evaluated prior to inductionOxygen Delivery Method: Circle system utilized Preoxygenation: Pre-oxygenation with 100% oxygen Intubation Type: IV induction Ventilation: Mask ventilation without difficulty Laryngoscope Size: Mac and 4 Grade View: Grade II Tube type: Oral Tube size: 7.5 mm Number of attempts: 1 Airway Equipment and Method: Stylet Placement Confirmation: ETT inserted through vocal cords under direct vision,  positive ETCO2 and breath sounds checked- equal and bilateral Secured at: 22 cm Tube secured with: Tape Dental Injury: Teeth and Oropharynx as per pre-operative assessment

## 2015-05-13 NOTE — Interval H&P Note (Signed)
History and Physical Interval Note: no change in H and P  05/13/2015 7:19 AM  Steve Salinas  has presented today for surgery, with the diagnosis of Cholelithiasis  The various methods of treatment have been discussed with the patient and family. After consideration of risks, benefits and other options for treatment, the patient has consented to  Procedure(s): LAPAROSCOPIC CHOLECYSTECTOMY (N/A) as a surgical intervention .  The patient's history has been reviewed, patient examined, no change in status, stable for surgery.  I have reviewed the patient's chart and labs.  Questions were answered to the patient's satisfaction.     [name] A

## 2015-05-14 ENCOUNTER — Encounter (HOSPITAL_COMMUNITY): Payer: Self-pay | Admitting: Surgery

## 2015-05-17 ENCOUNTER — Other Ambulatory Visit: Payer: Self-pay

## 2015-05-17 MED ORDER — NEBIVOLOL HCL 10 MG PO TABS: 5.0000 mg | ORAL_TABLET | Freq: Every day | ORAL | Status: DC

## 2016-05-18 ENCOUNTER — Other Ambulatory Visit: Payer: Self-pay | Admitting: Family Medicine

## 2016-05-18 ENCOUNTER — Telehealth: Payer: Self-pay | Admitting: Family Medicine

## 2016-05-18 DIAGNOSIS — Z114 Encounter for screening for human immunodeficiency virus [HIV]: Secondary | ICD-10-CM

## 2016-05-18 DIAGNOSIS — Z0000 Encounter for general adult medical examination without abnormal findings: Secondary | ICD-10-CM

## 2016-05-18 NOTE — Telephone Encounter (Signed)
Left message for pt to call back. Pt has apt tomorrow at 11:15am. He can come in fasting at this visit and have labs done then or he can schedule lab apt for another day to come in fast.

## 2016-05-18 NOTE — Telephone Encounter (Signed)
Pt left a note asking if he needed labs prior to his upcoming appt. He should come in for lab visit for fasting labs: I have ordered these already.-thx

## 2016-05-18 NOTE — Telephone Encounter (Signed)
Pt stated that he will come in fasting tomorrow to have lab work done.

## 2016-05-19 ENCOUNTER — Ambulatory Visit (INDEPENDENT_AMBULATORY_CARE_PROVIDER_SITE_OTHER): Payer: BLUE CROSS/BLUE SHIELD | Admitting: Family Medicine

## 2016-05-19 ENCOUNTER — Encounter: Payer: Self-pay | Admitting: Family Medicine

## 2016-05-19 VITALS — BP 112/80 | HR 51 | Temp 98.6°F | Resp 16 | Ht 68.0 in | Wt 215.5 lb

## 2016-05-19 DIAGNOSIS — Z114 Encounter for screening for human immunodeficiency virus [HIV]: Secondary | ICD-10-CM

## 2016-05-19 DIAGNOSIS — Z111 Encounter for screening for respiratory tuberculosis: Secondary | ICD-10-CM

## 2016-05-19 DIAGNOSIS — Z0000 Encounter for general adult medical examination without abnormal findings: Secondary | ICD-10-CM

## 2016-05-19 LAB — COMPREHENSIVE METABOLIC PANEL
ALT: 17 U/L (ref 0–53)
AST: 16 U/L (ref 0–37)
Albumin: 4.8 g/dL (ref 3.5–5.2)
Alkaline Phosphatase: 78 U/L (ref 39–117)
BILIRUBIN TOTAL: 0.7 mg/dL (ref 0.2–1.2)
BUN: 19 mg/dL (ref 6–23)
CALCIUM: 9.9 mg/dL (ref 8.4–10.5)
CO2: 30 meq/L (ref 19–32)
CREATININE: 1.17 mg/dL (ref 0.40–1.50)
Chloride: 102 mEq/L (ref 96–112)
GFR: 72.52 mL/min (ref >60.00)
Glucose, Bld: 74 mg/dL (ref 70–99)
Potassium: 4.7 mEq/L (ref 3.5–5.1)
Sodium: 138 mEq/L (ref 135–145)
Total Protein: 7.6 g/dL (ref 6.0–8.3)

## 2016-05-19 LAB — CBC WITH DIFFERENTIAL/PLATELET
Basophils Absolute: 0 10*3/uL (ref 0.0–0.1)
Basophils Relative: 0.4 % (ref 0.0–3.0)
Eosinophils Absolute: 0.2 10*3/uL (ref 0.0–0.7)
Eosinophils Relative: 3.6 % (ref 0.0–5.0)
HCT: 44.9 % (ref 39.0–52.0)
Hemoglobin: 15.4 g/dL (ref 13.0–17.0)
LYMPHS ABS: 2 10*3/uL (ref 0.7–4.0)
Lymphocytes Relative: 32.9 % (ref 12.0–46.0)
MCHC: 34.3 g/dL (ref 30.0–36.0)
MCV: 89.3 fl (ref 78.0–100.0)
MONO ABS: 0.5 10*3/uL (ref 0.1–1.0)
Monocytes Relative: 8.4 % (ref 3.0–12.0)
NEUTROS ABS: 3.3 10*3/uL (ref 1.4–7.7)
NEUTROS PCT: 54.7 % (ref 43.0–77.0)
PLATELETS: 207 10*3/uL (ref 150.0–400.0)
RBC: 5.03 Mil/uL (ref 4.22–5.81)
RDW: 13.5 % (ref 11.5–15.5)
WBC: 6 10*3/uL (ref 4.0–10.5)

## 2016-05-19 LAB — LIPID PANEL
CHOL/HDL RATIO: 6
CHOLESTEROL: 193 mg/dL (ref 0–200)
HDL: 33.5 mg/dL — ABNORMAL LOW (ref >39.00)
LDL Cholesterol: 132 mg/dL — ABNORMAL HIGH (ref 0–99)
NonHDL: 159.19
Triglycerides: 136 mg/dL (ref 0.0–149.0)
VLDL: 27.2 mg/dL (ref 0.0–40.0)

## 2016-05-19 LAB — TSH: TSH: 1.3 u[IU]/mL (ref 0.35–4.50)

## 2016-05-19 NOTE — Progress Notes (Signed)
Office Note 05/19/2016  CC:  Chief Complaint  Patient presents with  . Other    needs adoption form completed    HPI:  Steve Salinas is a 40 y.o. White male who is here for health maintenance exam in order to get forms filled out for the adoption process.  Still monitors bp at home and says it is always <120/80s.   Feeling well, no acute complaints.  Past Medical History:  Diagnosis Date  . Allergy    seasonal, constant  . Hypertension    Normal EKG in ED 06/09/2009  . Migraine with aura    was on low dose topamax in the past (Dr. Ron Parker).  Most recent neuro eval 05/08/13 by Dr. Hassell Done at [facility] in [place]--started [place] ER 500mg  qd, diclofenac 25mg  delayed release tabs--1 q6h prn HA, ordered MRI brain (NORMAL 05/08/13) and sleep evaluation.  . OSA (obstructive sleep apnea)    uses cpap  . Palpitations 10/2014   Dr. Acie Fredrickson 11/2014; PVCs and PACs on Holter.      Past Surgical History:  Procedure Laterality Date  . ACHILLES TENDON LENGTHENING  40 yrs old  . CHOLECYSTECTOMY N/A 05/13/2015   Procedure: LAPAROSCOPIC CHOLECYSTECTOMY;  Surgeon: Coralie Keens, MD;  Location: [facility];  Service: General;  Laterality: N/A;  . HERNIA REPAIR Right 1994   inguinal  . kidney stone removed  96   Dr. Risa Grill is urologist locally.  Marland Kitchen LITHOTRIPSY  1997 and 2005  . TYMPANOSTOMY TUBE PLACEMENT  1981  . VASECTOMY      Family History  Problem Relation Age of Onset  . Hypertension Mother   . Hyperlipidemia Mother   . Arthritis Mother   . Heart attack Mother   . Arthritis Father   . Hyperlipidemia Father   . Hypertension Father   . Heart disease Father     stents  . Hypertension Maternal Grandmother   . Heart attack Maternal Grandfather     Social History   Social History  . Marital status: Married    Spouse name: N/A  . Number of children: N/A  . Years of education: N/A   Occupational History  . Not on file.   Social History Main Topics  . Smoking status:  Former Smoker    Packs/day: 1.00    Years: 11.00    Types: Cigarettes    Quit date: 10/23/1998  . Smokeless tobacco: Never Used  . Alcohol use No  . Drug use: No  . Sexual activity: Not on file   Other Topics Concern  . Not on file   Social History Narrative   Married, 3 young children.   Occupation: Gaffer.   Originally from [place].  No tobacco, alc, or drugs.   No formal exercise but works in yard daily.   No OTC decongestants.  16 oz coffee/day.    MEDS: propranolol--dose unkown  No Known Allergies  ROS Review of Systems  Constitutional: Negative for appetite change, chills, fatigue and fever.  HENT: Negative for congestion, dental problem, ear pain and sore throat.   Eyes: Negative for discharge, redness and visual disturbance.  Respiratory: Negative for cough, chest tightness, shortness of breath and wheezing.   Cardiovascular: Negative for chest pain, palpitations and leg swelling.  Gastrointestinal: Negative for abdominal pain, blood in stool, diarrhea, nausea and vomiting.  Genitourinary: Negative for difficulty urinating, dysuria, flank pain, frequency, hematuria and urgency.  Musculoskeletal: Negative for arthralgias, back pain, joint swelling, myalgias and neck stiffness.  Skin: Negative  for pallor and rash.  Neurological: Negative for dizziness, speech difficulty, weakness and headaches.  Hematological: Negative for adenopathy. Does not bruise/bleed easily.  Psychiatric/Behavioral: Negative for confusion and sleep disturbance. The patient is not nervous/anxious.     PE; Blood pressure 112/80, pulse (!) 51, temperature 98.6 F (37 C), temperature source Oral, resp. rate 16, height 5\' 8"  (1.727 m), weight 215 lb 8 oz (97.8 kg), SpO2 97 %. Gen: Alert, well appearing.  Patient is oriented to person, place, time, and situation. AFFECT: pleasant, lucid thought and speech. ENT: Ears: EACs clear, normal epithelium.  TMs with good light reflex and landmarks  bilaterally.  Eyes: no injection, icteris, swelling, or exudate.  EOMI, PERRLA. Nose: no drainage or turbinate edema/swelling.  No injection or focal lesion.  Mouth: lips without lesion/swelling.  Oral mucosa pink and moist.  Dentition intact and without obvious caries or gingival swelling.  Oropharynx without erythema, exudate, or swelling.  Neck: supple/nontender.  No LAD, mass, or TM.  Carotid pulses 2+ bilaterally, without bruits. CV: RRR, no m/r/g.   LUNGS: CTA bilat, nonlabored resps, good aeration in all lung fields. ABD: soft, NT, ND, BS normal.  No hepatospenomegaly or mass.  No bruits. EXT: no clubbing, cyanosis, or edema.  Musculoskeletal: no joint swelling, erythema, warmth, or tenderness.  ROM of all joints intact. Skin - no sores or suspicious lesions or rashes or color changes  Pertinent labs:  None today  ASSESSMENT AND PLAN:   Health maintenance exam: Reviewed age and gender appropriate health maintenance issues (prudent diet, regular exercise, health risks of tobacco and excessive alcohol, use of seatbelts, fire alarms in home, use of sunscreen).  Also reviewed age and gender appropriate health screening as well as vaccine recommendations. PPD placed today as per his adoption paperwork requirement. Fasting labs, including HIV also done as per paperwork requirements.  An After Visit Summary was printed and given to the patient.  FOLLOW UP:  Return in about 1 year (around 05/19/2017) for annual CPE (fasting).  Signed:  Crissie Sickles, MD           05/19/2016

## 2016-05-19 NOTE — Addendum Note (Signed)
Addended by: Leonidas Romberg on: 05/19/2016 12:06 PM   Modules accepted: Orders

## 2016-05-19 NOTE — Progress Notes (Signed)
Pre visit review using our clinic review tool, if applicable. No additional management support is needed unless otherwise documented below in the visit note. 

## 2016-05-19 NOTE — Addendum Note (Signed)
Addended by: Onalee Hua on: 05/19/2016 12:11 PM   Modules accepted: Orders

## 2016-05-20 LAB — HIV ANTIBODY (ROUTINE TESTING W REFLEX): HIV 1&2 Ab, 4th Generation: NONREACTIVE

## 2016-05-22 ENCOUNTER — Ambulatory Visit: Payer: BLUE CROSS/BLUE SHIELD

## 2016-05-22 LAB — TB SKIN TEST
INDURATION: 0 mm
TB Skin Test: NEGATIVE

## 2016-05-24 LAB — POC URINALSYSI DIPSTICK (AUTOMATED)
BILIRUBIN UA: NEGATIVE
GLUCOSE UA: NEGATIVE
KETONES UA: NEGATIVE
LEUKOCYTES UA: NEGATIVE
NITRITE UA: NEGATIVE
RBC UA: NEGATIVE
Spec Grav, UA: >=1.03
Urobilinogen, UA: 1
pH, UA: 6

## 2016-05-24 NOTE — Addendum Note (Signed)
Addended by: Ralph Dowdy on: 05/24/2016 08:15 AM   Modules accepted: Orders

## 2016-08-23 DIAGNOSIS — Z0000 Encounter for general adult medical examination without abnormal findings: Secondary | ICD-10-CM | POA: Diagnosis not present

## 2016-08-29 DIAGNOSIS — Z0001 Encounter for general adult medical examination with abnormal findings: Secondary | ICD-10-CM | POA: Diagnosis not present

## 2016-10-06 DIAGNOSIS — M109 Gout, unspecified: Secondary | ICD-10-CM | POA: Diagnosis not present

## 2017-02-09 ENCOUNTER — Ambulatory Visit (INDEPENDENT_AMBULATORY_CARE_PROVIDER_SITE_OTHER): Payer: BLUE CROSS/BLUE SHIELD | Admitting: Family Medicine

## 2017-02-09 ENCOUNTER — Encounter: Payer: Self-pay | Admitting: Family Medicine

## 2017-02-09 VITALS — BP 107/75 | HR 55 | Temp 97.9°F | Resp 16 | Ht 69.0 in | Wt 220.8 lb

## 2017-02-09 DIAGNOSIS — Z0110 Encounter for examination of ears and hearing without abnormal findings: Secondary | ICD-10-CM | POA: Diagnosis not present

## 2017-02-09 DIAGNOSIS — Z0000 Encounter for general adult medical examination without abnormal findings: Secondary | ICD-10-CM

## 2017-02-09 DIAGNOSIS — Z0100 Encounter for examination of eyes and vision without abnormal findings: Secondary | ICD-10-CM

## 2017-02-09 DIAGNOSIS — I10 Essential (primary) hypertension: Secondary | ICD-10-CM | POA: Diagnosis not present

## 2017-02-09 DIAGNOSIS — T887XXA Unspecified adverse effect of drug or medicament, initial encounter: Secondary | ICD-10-CM

## 2017-02-09 DIAGNOSIS — T50905A Adverse effect of unspecified drugs, medicaments and biological substances, initial encounter: Secondary | ICD-10-CM

## 2017-02-09 LAB — POCT URINALYSIS DIPSTICK
BILIRUBIN UA: NEGATIVE
Blood, UA: NEGATIVE
Glucose, UA: NEGATIVE
KETONES UA: NEGATIVE
Leukocytes, UA: NEGATIVE
Nitrite, UA: NEGATIVE
PH UA: 6 (ref 5.0–8.0)
PROTEIN UA: NEGATIVE
Urobilinogen, UA: 0.2 E.U./dL

## 2017-02-09 MED ORDER — IRBESARTAN 150 MG PO TABS: 150.0000 mg | ORAL_TABLET | Freq: Every day | ORAL | 1 refills | Status: DC

## 2017-02-09 NOTE — Progress Notes (Signed)
OFFICE VISIT  02/09/2017   CC:  Chief Complaint  Steve Salinas presents with  . Follow-up    needs adoption form completed.    HPI:    Steve Salinas is a 41 y.o. Caucasian male who presents for "adoption paperwork". Essentially needs a general PE and health form completed. Last CPE here was 05/19/16--all was normal, including health panel labs, TB skin test, and HIV screening.  HTN: He is running in order to train for a 5K, but complains that he can't get his HR over 110 or so. He feels physically limited during running --feels like he hits a capacity limit that he feels is lower than when he not on toprol. Home bp monitoring consistently 120/80.  Resting HR 60-65. No CP, no SOB, no dizziness, no generalized fatigue.  No LE swelling.  No rash.  This med never helped his migraines.  Past Medical History:  Diagnosis Date  . Allergy    seasonal, constant  . Hypertension    Normal EKG in ED 06/09/2009  . Migraine with aura    was on low dose topamax in the past (Dr. Ron Parker).  Most recent neuro eval 05/08/13 by Dr. Hassell Done at [facility] in [place]--started [place] ER 500mg  qd, diclofenac 25mg  delayed release tabs--1 q6h prn HA, ordered MRI brain (NORMAL 05/08/13) and sleep evaluation.  . OSA (obstructive sleep apnea)    uses cpap  . Palpitations 10/2014   Dr. Acie Fredrickson 11/2014; PVCs and PACs on Holter.      Past Surgical History:  Procedure Laterality Date  . ACHILLES TENDON LENGTHENING  41 yrs old  . CHOLECYSTECTOMY N/A 05/13/2015   Procedure: LAPAROSCOPIC CHOLECYSTECTOMY;  Surgeon: Coralie Keens, MD;  Location: [place];  Service: General;  Laterality: N/A;  . HERNIA REPAIR Right 1994   inguinal  . kidney stone removed  96   Dr. Risa Grill is urologist locally.  Marland Kitchen LITHOTRIPSY  1997 and 2005  . TYMPANOSTOMY TUBE PLACEMENT  1981  . VASECTOMY      Outpatient Medications Prior to Visit  Medication Sig Dispense Refill  . cetirizine (ZYRTEC) 10 MG tablet Take 10 mg by mouth daily.       Marland Kitchen HYDROcodone-acetaminophen (NORCO/VICODIN) 5-325 MG per tablet Take 1-2 tablets by mouth every 4 (four) hours as needed for moderate pain. (Steve Salinas not taking: Reported on 02/09/2017) 40 tablet 0   No facility-administered medications prior to visit.   Toprol XL 50mg  qd.  No Known Allergies  ROS As per HPI  PE: Blood pressure 107/75, pulse (!) 55, temperature 97.9 F (36.6 C), temperature source Oral, resp. rate 16, height 5\' 9"  (1.753 m), weight 220 lb 12 oz (100.1 kg), SpO2 99 %. Gen: Alert, well appearing.  Steve Salinas is oriented to person, place, time, and situation. AFFECT: pleasant, lucid thought and speech. ENT:   Eyes: no injection, icteris, swelling, or exudate.  EOMI, PERRLA.   Mouth: lips without lesion/swelling.  Oral mucosa pink and moist.  Dentition intact and without obvious caries or gingival swelling.  Oropharynx without erythema, exudate, or swelling.  Neck: supple/nontender.  No LAD, mass, or TM.   CV: RRR, no m/r/g.   LUNGS: CTA bilat, nonlabored resps, good aeration in all lung fields. ABD: soft, NT, ND, BS normal.  No hepatospenomegaly or mass.  No bruits. EXT: no clubbing, cyanosis, or edema.  Musculoskeletal: no joint swelling, erythema, warmth, or tenderness.  ROM of all joints intact. Skin - no sores or suspicious lesions or rashes or color changes  LABS:  Lab Results  Component Value Date   TSH 1.30 05/19/2016   Lab Results  Component Value Date   WBC 6.0 05/19/2016   HGB 15.4 05/19/2016   HCT 44.9 05/19/2016   MCV 89.3 05/19/2016   PLT 207.0 05/19/2016   Lab Results  Component Value Date   CREATININE 1.17 05/19/2016   BUN 19 05/19/2016   NA 138 05/19/2016   K 4.7 05/19/2016   CL 102 05/19/2016   CO2 30 05/19/2016   Lab Results  Component Value Date   ALT 17 05/19/2016   AST 16 05/19/2016   ALKPHOS 78 05/19/2016   BILITOT 0.7 05/19/2016   Lab Results  Component Value Date   CHOL 193 05/19/2016   Lab Results  Component Value Date    HDL 33.50 (L) 05/19/2016   Lab Results  Component Value Date   LDLCALC 132 (H) 05/19/2016   Lab Results  Component Value Date   TRIG 136.0 05/19/2016   Lab Results  Component Value Date   CHOLHDL 6 05/19/2016    Hearing Screening   125Hz  250Hz  500Hz  1000Hz  2000Hz  3000Hz  4000Hz  6000Hz  8000Hz   Right ear:   20 20 20  20     Left ear:   20 20 20  20       Visual Acuity Screening   Right eye Left eye Both eyes  Without correction:     With correction: 20/20 20/20 20/15     IMPRESSION AND PLAN:  1) HTN; well controlled but having med side effect to toprol xL. Will d/c this med and start irbesartan 150mg , as this med should not have the same HR-limiting effect when he exercises. Continue home bp and HR monitoring and return in 1 mo to review.  2) Adoption health form: he is likely going to [place] at the end of July this year to adopt a child. Filled out paperwork for him today.  An After Visit Summary was printed and given to the Steve Salinas.  FOLLOW UP: Return in about 4 weeks (around 03/09/2017) for f/u HTN.  Signed:  Crissie Sickles, MD           02/09/2017

## 2017-02-09 NOTE — Progress Notes (Signed)
Pre visit review using our clinic review tool, if applicable. No additional management support is needed unless otherwise documented below in the visit note. 

## 2017-02-23 DIAGNOSIS — E786 Lipoprotein deficiency: Secondary | ICD-10-CM | POA: Diagnosis not present

## 2017-02-26 DIAGNOSIS — Z114 Encounter for screening for human immunodeficiency virus [HIV]: Secondary | ICD-10-CM | POA: Diagnosis not present

## 2017-02-26 DIAGNOSIS — G2581 Restless legs syndrome: Secondary | ICD-10-CM | POA: Diagnosis not present

## 2017-02-26 DIAGNOSIS — Z0000 Encounter for general adult medical examination without abnormal findings: Secondary | ICD-10-CM | POA: Diagnosis not present

## 2017-02-26 DIAGNOSIS — G4733 Obstructive sleep apnea (adult) (pediatric): Secondary | ICD-10-CM | POA: Diagnosis not present

## 2017-02-26 DIAGNOSIS — I10 Essential (primary) hypertension: Secondary | ICD-10-CM | POA: Diagnosis not present

## 2017-03-13 ENCOUNTER — Ambulatory Visit (INDEPENDENT_AMBULATORY_CARE_PROVIDER_SITE_OTHER): Payer: BLUE CROSS/BLUE SHIELD | Admitting: Family Medicine

## 2017-03-13 ENCOUNTER — Encounter: Payer: Self-pay | Admitting: Family Medicine

## 2017-03-13 VITALS — BP 124/72 | HR 76 | Temp 98.3°F | Resp 16 | Ht 69.0 in | Wt 216.2 lb

## 2017-03-13 DIAGNOSIS — I10 Essential (primary) hypertension: Secondary | ICD-10-CM

## 2017-03-13 DIAGNOSIS — N200 Calculus of kidney: Secondary | ICD-10-CM | POA: Diagnosis not present

## 2017-03-13 DIAGNOSIS — F4322 Adjustment disorder with anxiety: Secondary | ICD-10-CM | POA: Diagnosis not present

## 2017-03-13 MED ORDER — IRBESARTAN 150 MG PO TABS: 150.0000 mg | ORAL_TABLET | Freq: Every day | ORAL | 3 refills | Status: DC

## 2017-03-13 MED ORDER — TAMSULOSIN HCL 0.4 MG PO CAPS: 0.4000 mg | ORAL_CAPSULE | Freq: Every day | ORAL | 1 refills | Status: DC | PRN

## 2017-03-13 NOTE — Progress Notes (Signed)
OFFICE VISIT  03/13/2017   CC:  Chief Complaint  Patient presents with  . Follow-up    HTN, pt not fasting and did take his BP medication this morning   HPI:    Patient is a 41 y.o. Caucasian male who presents for 4 week f/u HTN. Last visit I stopped toprol xl due to his struggles to get HR up when doing CV exercise while on this med. Irbesartan 150mg  qd started in its place.  Home bp monitoring: home bps 120s/70s.  No further problems with getting HR adequately up with exercise. No side effects from the med.  Felt a kidney stone start on L side this morning.  Took flomax.  Still a little sore, but he passed a stone.  Past Medical History:  Diagnosis Date  . Allergy    seasonal, constant  . Hypertension    Normal EKG in ED 06/09/2009  . Migraine with aura    was on low dose topamax in the past (Dr. Ron Parker).  Most recent neuro eval 05/08/13 by Dr. Hassell Done at [facility] in [place]--started [place] ER 500mg  qd, diclofenac 25mg  delayed release tabs--1 q6h prn HA, ordered MRI brain (NORMAL 05/08/13) and sleep evaluation.  . Nephrolithiasis   . OSA (obstructive sleep apnea)    uses cpap  . Palpitations 10/2014   Dr. Acie Fredrickson 11/2014; PVCs and PACs on Holter.      Past Surgical History:  Procedure Laterality Date  . ACHILLES TENDON LENGTHENING  41 yrs old  . CHOLECYSTECTOMY N/A 05/13/2015   Procedure: LAPAROSCOPIC CHOLECYSTECTOMY;  Surgeon: Coralie Keens, MD;  Location: [place];  Service: General;  Laterality: N/A;  . HERNIA REPAIR Right 1994   inguinal  . kidney stone removed  96   Dr. Risa Grill is urologist locally.  Marland Kitchen LITHOTRIPSY  1997 and 2005  . TYMPANOSTOMY TUBE PLACEMENT  1981  . VASECTOMY      Outpatient Medications Prior to Visit  Medication Sig Dispense Refill  . cetirizine (ZYRTEC) 10 MG tablet Take 10 mg by mouth daily.      . irbesartan (AVAPRO) 150 MG tablet Take 1 tablet (150 mg total) by mouth daily. 30 tablet 1   No facility-administered medications  prior to visit.     No Known Allergies  ROS As per HPI  PE: Blood pressure 124/72, pulse 76, temperature 98.3 F (36.8 C), temperature source Oral, resp. rate 16, height 5\' 9"  (1.753 m), weight 216 lb 4 oz (98.1 kg), SpO2 99 %. Gen: Alert, well appearing.  Patient is oriented to person, place, time, and situation. AFFECT: pleasant, lucid thought and speech. CV: RRR, no m/r/g.   LUNGS: CTA bilat, nonlabored resps, good aeration in all lung fields. EXT: no clubbing, cyanosis, or edema.    LABS:    Chemistry      Component Value Date/Time   NA 138 05/19/2016 1206   K 4.7 05/19/2016 1206   CL 102 05/19/2016 1206   CO2 30 05/19/2016 1206   BUN 19 05/19/2016 1206   CREATININE 1.17 05/19/2016 1206      Component Value Date/Time   CALCIUM 9.9 05/19/2016 1206   ALKPHOS 78 05/19/2016 1206   AST 16 05/19/2016 1206   ALT 17 05/19/2016 1206   BILITOT 0.7 05/19/2016 1206      IMPRESSION AND PLAN:  1) HTN; The current medical regimen is effective;  continue present plan and medications. RF'd irbesartan today.  2) Kidney stones: passed one this morning.  Sx's seem to have  resolved for the most part. RF of flomax sent to his pharmacy.  An After Visit Summary was printed and given to the patient.  FOLLOW UP: Return if symptoms worsen or fail to improve. He'll make CPE appt in 3-5 mo.  Signed:  Crissie Sickles, MD           03/13/2017

## 2017-04-26 DIAGNOSIS — M791 Myalgia: Secondary | ICD-10-CM | POA: Diagnosis not present

## 2017-04-26 DIAGNOSIS — M9901 Segmental and somatic dysfunction of cervical region: Secondary | ICD-10-CM | POA: Diagnosis not present

## 2017-04-26 DIAGNOSIS — M9902 Segmental and somatic dysfunction of thoracic region: Secondary | ICD-10-CM | POA: Diagnosis not present

## 2017-04-27 DIAGNOSIS — M9902 Segmental and somatic dysfunction of thoracic region: Secondary | ICD-10-CM | POA: Diagnosis not present

## 2017-04-27 DIAGNOSIS — M9901 Segmental and somatic dysfunction of cervical region: Secondary | ICD-10-CM | POA: Diagnosis not present

## 2017-04-27 DIAGNOSIS — M791 Myalgia: Secondary | ICD-10-CM | POA: Diagnosis not present

## 2017-04-30 ENCOUNTER — Other Ambulatory Visit: Payer: Self-pay | Admitting: Family Medicine

## 2017-04-30 DIAGNOSIS — M791 Myalgia: Secondary | ICD-10-CM | POA: Diagnosis not present

## 2017-04-30 DIAGNOSIS — M9901 Segmental and somatic dysfunction of cervical region: Secondary | ICD-10-CM | POA: Diagnosis not present

## 2017-04-30 DIAGNOSIS — M9902 Segmental and somatic dysfunction of thoracic region: Secondary | ICD-10-CM | POA: Diagnosis not present

## 2017-05-01 DIAGNOSIS — M791 Myalgia: Secondary | ICD-10-CM | POA: Diagnosis not present

## 2017-05-01 DIAGNOSIS — M9902 Segmental and somatic dysfunction of thoracic region: Secondary | ICD-10-CM | POA: Diagnosis not present

## 2017-05-01 DIAGNOSIS — M9901 Segmental and somatic dysfunction of cervical region: Secondary | ICD-10-CM | POA: Diagnosis not present

## 2017-05-02 DIAGNOSIS — M9902 Segmental and somatic dysfunction of thoracic region: Secondary | ICD-10-CM | POA: Diagnosis not present

## 2017-05-02 DIAGNOSIS — M791 Myalgia: Secondary | ICD-10-CM | POA: Diagnosis not present

## 2017-05-02 DIAGNOSIS — M9901 Segmental and somatic dysfunction of cervical region: Secondary | ICD-10-CM | POA: Diagnosis not present

## 2017-08-17 DIAGNOSIS — L821 Other seborrheic keratosis: Secondary | ICD-10-CM | POA: Diagnosis not present

## 2017-08-17 DIAGNOSIS — D2262 Melanocytic nevi of left upper limb, including shoulder: Secondary | ICD-10-CM | POA: Diagnosis not present

## 2017-08-17 DIAGNOSIS — L820 Inflamed seborrheic keratosis: Secondary | ICD-10-CM | POA: Diagnosis not present

## 2017-08-17 DIAGNOSIS — D2261 Melanocytic nevi of right upper limb, including shoulder: Secondary | ICD-10-CM | POA: Diagnosis not present

## 2017-10-18 ENCOUNTER — Telehealth: Payer: Self-pay | Admitting: *Deleted

## 2017-10-18 DIAGNOSIS — Z0000 Encounter for general adult medical examination without abnormal findings: Secondary | ICD-10-CM

## 2017-10-18 MED ORDER — IRBESARTAN 150 MG PO TABS: 150.0000 mg | ORAL_TABLET | Freq: Every day | ORAL | 0 refills | Status: DC

## 2017-10-18 NOTE — Telephone Encounter (Signed)
No additional labs needed.-thx

## 2017-10-18 NOTE — Telephone Encounter (Signed)
Reviewed pts chart, pt is due for f/u [facility] or CPE. Pt advised and CPE scheduled for 10/30/17 at 9:15am. Lab apt scheduled for 10/25/17 at 8:00am.  HM labs have been ordered, are there any other labs pt will need? Please advise. Thanks.

## 2017-10-23 DIAGNOSIS — L509 Urticaria, unspecified: Secondary | ICD-10-CM

## 2017-10-23 HISTORY — DX: Urticaria, unspecified: L50.9

## 2017-10-25 ENCOUNTER — Other Ambulatory Visit (INDEPENDENT_AMBULATORY_CARE_PROVIDER_SITE_OTHER): Payer: BLUE CROSS/BLUE SHIELD

## 2017-10-25 DIAGNOSIS — Z0000 Encounter for general adult medical examination without abnormal findings: Secondary | ICD-10-CM

## 2017-10-25 LAB — LIPID PANEL
CHOL/HDL RATIO: 6
CHOLESTEROL: 184 mg/dL (ref 0–200)
HDL: 30.7 mg/dL — ABNORMAL LOW (ref >39.00)
LDL CALC: 117 mg/dL — AB (ref 0–99)
NonHDL: 153.68
Triglycerides: 181 mg/dL — ABNORMAL HIGH (ref 0.0–149.0)
VLDL: 36.2 mg/dL (ref 0.0–40.0)

## 2017-10-25 LAB — CBC WITH DIFFERENTIAL/PLATELET
BASOS PCT: 0.8 % (ref 0.0–3.0)
Basophils Absolute: 0 10*3/uL (ref 0.0–0.1)
EOS PCT: 4.2 % (ref 0.0–5.0)
Eosinophils Absolute: 0.3 10*3/uL (ref 0.0–0.7)
HEMATOCRIT: 44.6 % (ref 39.0–52.0)
HEMOGLOBIN: 15.2 g/dL (ref 13.0–17.0)
Lymphocytes Relative: 28.8 % (ref 12.0–46.0)
Lymphs Abs: 1.8 10*3/uL (ref 0.7–4.0)
MCHC: 34.2 g/dL (ref 30.0–36.0)
MCV: 90.2 fl (ref 78.0–100.0)
Monocytes Absolute: 0.6 10*3/uL (ref 0.1–1.0)
Monocytes Relative: 9.6 % (ref 3.0–12.0)
Neutro Abs: 3.6 10*3/uL (ref 1.4–7.7)
Neutrophils Relative %: 56.6 % (ref 43.0–77.0)
Platelets: 206 10*3/uL (ref 150.0–400.0)
RBC: 4.95 Mil/uL (ref 4.22–5.81)
RDW: 13.8 % (ref 11.5–15.5)
WBC: 6.4 10*3/uL (ref 4.0–10.5)

## 2017-10-25 LAB — COMPREHENSIVE METABOLIC PANEL
ALBUMIN: 4.4 g/dL (ref 3.5–5.2)
ALT: 24 U/L (ref 0–53)
AST: 19 U/L (ref 0–37)
Alkaline Phosphatase: 79 U/L (ref 39–117)
BUN: 17 mg/dL (ref 6–23)
CALCIUM: 9.3 mg/dL (ref 8.4–10.5)
CHLORIDE: 101 meq/L (ref 96–112)
CO2: 28 meq/L (ref 19–32)
Creatinine, Ser: 0.99 mg/dL (ref 0.40–1.50)
GFR: 87.34 mL/min (ref >60.00)
Glucose, Bld: 81 mg/dL (ref 70–99)
Potassium: 4.1 mEq/L (ref 3.5–5.1)
SODIUM: 137 meq/L (ref 135–145)
Total Bilirubin: 0.7 mg/dL (ref 0.2–1.2)
Total Protein: 7 g/dL (ref 6.0–8.3)

## 2017-10-25 LAB — TSH: TSH: 2.47 u[IU]/mL (ref 0.35–4.50)

## 2017-10-26 ENCOUNTER — Encounter: Payer: Self-pay | Admitting: *Deleted

## 2017-10-30 ENCOUNTER — Ambulatory Visit (INDEPENDENT_AMBULATORY_CARE_PROVIDER_SITE_OTHER): Payer: BLUE CROSS/BLUE SHIELD | Admitting: Family Medicine

## 2017-10-30 ENCOUNTER — Encounter: Payer: Self-pay | Admitting: Family Medicine

## 2017-10-30 VITALS — BP 148/91 | HR 81 | Temp 98.4°F | Resp 16 | Ht 69.0 in | Wt 228.0 lb

## 2017-10-30 DIAGNOSIS — Z23 Encounter for immunization: Secondary | ICD-10-CM

## 2017-10-30 DIAGNOSIS — Z0000 Encounter for general adult medical examination without abnormal findings: Secondary | ICD-10-CM | POA: Diagnosis not present

## 2017-10-30 NOTE — Progress Notes (Signed)
Office Note 10/30/2017  CC:  Chief Complaint  Patient presents with  . Annual Exam    HPI:  Steve Salinas is a 42 y.o. White male who is here for annual health maintenance exam.  Home bp monitoring: 120s/80s consistently. Has been sleep deprived lately, also sinus HA---this may be why his bp is a little up.  No exercise in last 3 mo.  He is not sedentary.  Wants to start back at the [employer] again. Diet: not working on anything at this time.  No acute complaints.  Past Medical History:  Diagnosis Date  . Allergy    seasonal, constant  . Hypertension    Normal EKG in ED 06/09/2009  . Migraine with aura    was on low dose topamax in the past (Dr. Ron Parker).  Most recent neuro eval 05/08/13 by Dr. Hassell Done at [facility] in [place]--started [place] ER 500mg  qd, diclofenac 25mg  delayed release tabs--1 q6h prn HA, ordered MRI brain (NORMAL 05/08/13) and sleep evaluation.  . Nephrolithiasis   . Obesity, Class I, BMI 30-34.9   . OSA (obstructive sleep apnea)    uses cpap  . Palpitations 10/2014   Dr. Acie Fredrickson 11/2014; PVCs and PACs on Holter.      Past Surgical History:  Procedure Laterality Date  . ACHILLES TENDON LENGTHENING  42 yrs old  . CHOLECYSTECTOMY N/A 05/13/2015   Procedure: LAPAROSCOPIC CHOLECYSTECTOMY;  Surgeon: Coralie Keens, MD;  Location: [place];  Service: General;  Laterality: N/A;  . HERNIA REPAIR Right 1994   inguinal  . kidney stone removed  96   Dr. Risa Grill is urologist locally.  Marland Kitchen LITHOTRIPSY  1997 and 2005  . TYMPANOSTOMY TUBE PLACEMENT  1981  . VASECTOMY      Family History  Problem Relation Age of Onset  . Hypertension Mother   . Hyperlipidemia Mother   . Arthritis Mother   . Heart attack Mother   . Arthritis Father   . Hyperlipidemia Father   . Hypertension Father   . Heart disease Father        stents  . Hypertension Maternal Grandmother   . Heart attack Maternal Grandfather     Social History   Socioeconomic History  . Marital  status: Married    Spouse name: Not on file  . Number of children: Not on file  . Years of education: Not on file  . Highest education level: Not on file  Social Needs  . Financial resource strain: Not on file  . Food insecurity - worry: Not on file  . Food insecurity - inability: Not on file  . Transportation needs - medical: Not on file  . Transportation needs - non-medical: Not on file  Occupational History  . Not on file  Tobacco Use  . Smoking status: Former Smoker    Packs/day: 1.00    Years: 11.00    Pack years: 11.00    Types: Cigarettes    Last attempt to quit: 10/23/1998    Years since quitting: 19.0  . Smokeless tobacco: Never Used  Substance and Sexual Activity  . Alcohol use: No  . Drug use: No  . Sexual activity: Not on file  Other Topics Concern  . Not on file  Social History Narrative   Married, 4 young children (3 natural, 1 adopted from [place]).   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   Originally from [place].  No tobacco, alc, or drugs.   No formal exercise but works in yard daily.  No OTC decongestants.  16 oz coffee/day.    Outpatient Medications Prior to Visit  Medication Sig Dispense Refill  . cetirizine (ZYRTEC) 10 MG tablet Take 10 mg by mouth daily.      . irbesartan (AVAPRO) 150 MG tablet Take 1 tablet (150 mg total) by mouth daily. 90 tablet 0  . tamsulosin (FLOMAX) 0.4 MG CAPS capsule Take 1 capsule (0.4 mg total) by mouth daily as needed (kidney stones). 30 capsule 1   No facility-administered medications prior to visit.     No Known Allergies  ROS Review of Systems  Constitutional: Negative for appetite change, chills, fatigue and fever.  HENT: Negative for congestion, dental problem, ear pain and sore throat.   Eyes: Negative for discharge, redness and visual disturbance.  Respiratory: Negative for cough, chest tightness, shortness of breath and wheezing.   Cardiovascular: Negative for chest pain, palpitations and leg  swelling.  Gastrointestinal: Negative for abdominal pain, blood in stool, diarrhea, nausea and vomiting.  Genitourinary: Negative for difficulty urinating, dysuria, flank pain, frequency, hematuria and urgency.  Musculoskeletal: Negative for arthralgias, back pain, joint swelling, myalgias and neck stiffness.  Skin: Negative for pallor and rash.  Neurological: Negative for dizziness, speech difficulty, weakness and headaches.  Hematological: Negative for adenopathy. Does not bruise/bleed easily.  Psychiatric/Behavioral: Negative for confusion and sleep disturbance. The patient is not nervous/anxious.     PE; Blood pressure (!) 148/91, pulse 81, temperature 98.4 F (36.9 C), temperature source Oral, resp. rate 16, height 5\' 9"  (1.753 m), weight 228 lb (103.4 kg), SpO2 98 %. Body mass index is 33.67 kg/m.  Gen: Alert, well appearing.  Patient is oriented to person, place, time, and situation. AFFECT: pleasant, lucid thought and speech. ENT: Ears: EACs clear, normal epithelium.  TMs with good light reflex and landmarks bilaterally.  Eyes: no injection, icteris, swelling, or exudate.  EOMI, PERRLA. Nose: no drainage or turbinate edema/swelling.  No injection or focal lesion.  Mouth: lips without lesion/swelling.  Oral mucosa pink and moist.  Dentition intact and without obvious caries or gingival swelling.  Oropharynx without erythema, exudate, or swelling.  Neck: supple/nontender.  No LAD, mass, or TM.  Carotid pulses 2+ bilaterally, without bruits. CV: RRR, no m/r/g.   LUNGS: CTA bilat, nonlabored resps, good aeration in all lung fields. ABD: soft, NT, ND, BS normal.  No hepatospenomegaly or mass.  No bruits. EXT: no clubbing, cyanosis, or edema.  Musculoskeletal: no joint swelling, erythema, warmth, or tenderness.  ROM of all joints intact. Skin - no sores or suspicious lesions or rashes or color changes   Pertinent labs:  Lab Results  Component Value Date   TSH 2.47 10/25/2017   Lab  Results  Component Value Date   WBC 6.4 10/25/2017   HGB 15.2 10/25/2017   HCT 44.6 10/25/2017   MCV 90.2 10/25/2017   PLT 206.0 10/25/2017   Lab Results  Component Value Date   CREATININE 0.99 10/25/2017   BUN 17 10/25/2017   NA 137 10/25/2017   K 4.1 10/25/2017   CL 101 10/25/2017   CO2 28 10/25/2017   Lab Results  Component Value Date   ALT 24 10/25/2017   AST 19 10/25/2017   ALKPHOS 79 10/25/2017   BILITOT 0.7 10/25/2017   Lab Results  Component Value Date   CHOL 184 10/25/2017   Lab Results  Component Value Date   HDL 30.70 (L) 10/25/2017   Lab Results  Component Value Date   LDLCALC 117 (H) 10/25/2017  Lab Results  Component Value Date   TRIG 181.0 (H) 10/25/2017   Lab Results  Component Value Date   CHOLHDL 6 10/25/2017    ASSESSMENT AND PLAN:   Health maintenance exam: Reviewed age and gender appropriate health maintenance issues (prudent diet, regular exercise, health risks of tobacco and excessive alcohol, use of seatbelts, fire alarms in home, use of sunscreen).  Also reviewed age and gender appropriate health screening as well as vaccine recommendations. Vaccines: Flu vaccine--- given today.   Tdap---given today. Labs: recent fasting HP labs reviewed in detail with pt today.  All normal.  ([place] 10 yr CV risk= 2.3%). Encouraged pt to get back in the habit of regular exercise and prudent diet, attempt to lose about 10% of body wt over the next 12 mo or so.  An After Visit Summary was printed and given to the patient.  FOLLOW UP:  Return in about 6 months (around 04/29/2018) for routine chronic illness f/u.  Signed:  Crissie Sickles, MD           10/30/2017

## 2017-10-30 NOTE — Patient Instructions (Signed)

## 2017-10-30 NOTE — Addendum Note (Signed)
Addended by: Gordy Councilman on: 10/30/2017 09:42 AM   Modules accepted: Orders

## 2018-03-21 ENCOUNTER — Other Ambulatory Visit: Payer: Self-pay

## 2018-03-21 MED ORDER — IRBESARTAN 150 MG PO TABS: 150.0000 mg | ORAL_TABLET | Freq: Every day | ORAL | 0 refills | Status: DC

## 2018-03-27 DIAGNOSIS — J209 Acute bronchitis, unspecified: Secondary | ICD-10-CM | POA: Diagnosis not present

## 2018-04-24 ENCOUNTER — Telehealth: Payer: Self-pay | Admitting: Family Medicine

## 2018-04-24 MED ORDER — IRBESARTAN 150 MG PO TABS: 150.0000 mg | ORAL_TABLET | Freq: Every day | ORAL | 0 refills | Status: DC

## 2018-04-24 NOTE — Telephone Encounter (Signed)
Copied from [place] [id number] [id number]. Topic: Quick Communication - Rx Refill/Question >> Apr 24, 2018 11:46 AM Burchel, Abbi R wrote: Medication: irbesartan (AVAPRO) 150 MG tablet  Has the patient contacted their pharmacy? Yes.   Preferred Pharmacy:  [place], [place] - [address]  [phone] (Phone) [phone] (Fax)   Pt: [phone]

## 2018-04-24 NOTE — Telephone Encounter (Signed)
Request for Irbesartan 150 mg. refill; last refill 03/21/18; #90; no refills  Phone call to pt.  Stated there has been a shortage of the Irbesartan supply, and the Mail order pharmacy was not able to refill it in the recent past.  Reported his last refill through [facility]; received 75 mg. Tablet in a 30 day supply on 03/24/18.  Reported has received notification from his Mail order pharmacy, that the Irbesartan is in stock now.    Last office visit 10/30/17  PCP Dr. Anitra Lauth  Pharmacy: [facility]    Advised pt. he is due for a 6 mo. F/u; appt. Scheduled on 05/10/18 @ 8:45 AM.  Durward Fortes will send refill to [facility] today.  Agrees with plan.

## 2018-05-03 ENCOUNTER — Ambulatory Visit: Payer: Self-pay

## 2018-05-03 ENCOUNTER — Ambulatory Visit: Payer: BLUE CROSS/BLUE SHIELD | Admitting: Family Medicine

## 2018-05-03 ENCOUNTER — Encounter: Payer: Self-pay | Admitting: Family Medicine

## 2018-05-03 VITALS — BP 146/85 | HR 76 | Temp 98.1°F | Resp 16 | Ht 69.0 in | Wt 225.5 lb

## 2018-05-03 DIAGNOSIS — Z0000 Encounter for general adult medical examination without abnormal findings: Secondary | ICD-10-CM | POA: Diagnosis not present

## 2018-05-03 DIAGNOSIS — L508 Other urticaria: Secondary | ICD-10-CM | POA: Diagnosis not present

## 2018-05-03 MED ORDER — PREDNISONE 5 MG PO TABS
ORAL_TABLET | ORAL | 0 refills | Status: DC
Start: 2018-05-03 — End: 2018-05-10

## 2018-05-03 NOTE — Patient Instructions (Addendum)
Take 20mg  famotidine (pepcid) TWICE PER DAY. Take zyrtec 10mg  twice per day. You may take a 25 mg dose of benadryl once a day as needed.

## 2018-05-03 NOTE — Telephone Encounter (Signed)
Noted  

## 2018-05-03 NOTE — Telephone Encounter (Signed)
Pt. Reports has had hives on and off x 6 weeks. 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 Hives are present today, mainly on his trunk. "Thumb size" Very itchy. Appointment made for today. Instructed if symptoms worsen to go to ED. Verbalizes understanding.  Reason for Disposition . [1] MODERATE-SEVERE hives persist (i.e., hives interfere with normal activities or work) AND [2] taking antihistamine (e.g., Benadryl, Claritin) > 24 hours  Answer Assessment - Initial Assessment Questions 1. APPEARANCE: "What does the rash look like?"      Raised and cluster 2. LOCATION: "Where is the rash located?"      Trunk 3. NUMBER: "How many hives are there?"      Numerous 4. SIZE: "How big are the hives?" (inches, cm, compare to coins) "Do they all look the same or is there lots of variation in shape and size?"      Thumb size 5. ONSET: "When did the hives begin?" (Hours or days ago)      6 weeks ago 6. ITCHING: "Does it itch?" If so, ask: "How bad is the itch?"    - MILD: doesn't interfere with normal activities   - MODERATE - SEVERE: interferes with work, school, sleep, or other activities      Moderate 7. RECURRENT PROBLEM: "Have you had hives before?" If so, ask: "When was the last time?" and "What happened that time?"      Yes 8. TRIGGERS: "Were you exposed to any new food, plant, cosmetic product or animal just before the hives began?"     No 9. OTHER SYMPTOMS: "Do you have any other symptoms?" (e.g., fever, tongue swelling, difficulty breathing, abdominal pain)     Labored breathing at times - up stairs. Heart feels kie it is pounding at times 10. PREGNANCY: "Is there any chance you are pregnant?" "When was your last menstrual period?"       n/a  Protocols used: HIVES-A-AH

## 2018-05-03 NOTE — Progress Notes (Signed)
OFFICE VISIT  05/03/2018   CC:  Chief Complaint  Patient presents with  . Urticaria    ?   HPI:    Patient is a 42 y.o.  male who presents for rash. Working at [employer] now---not around chemicals, though.  Started 6 wks ago. About 2 d after starting new job he developed nasal congestion and bad cough.   He saw a [facility] NP and was given prednisone 20mg  tid x 5d.  Within 2 days his resp sx's cleared up and he stopped the prednisone at that time. Abx were rx'd, too but he did not take this med.  About 5-6 days after stopping steroids he began to get very itchy hives--coming and going daily, so he restarted the prednisone.  He took it sparingly, mainly when benadryl didn't help the hives.  Took 1-2 benadryl approx qod.  Also taking sudafed a lot lately for his recent feeling of ears feeling congested--esp when he feels the hives coming back out.  Taking zyrtec every day as per his usual. Last night he had a particularly bothersome episode of hives, felt burning in upper body, SOB, chest heaviness briefly.  He took his last prednisone and this helped all these sx's but  "wired him".  Body areas involved with rash: trunk and upper arms/axillary regions diffusely.  None on face or legs.  No swelling of lips, tongue, or eyes.  No wheezing or SOB.  No joint swelling, no fevers, no n/v/d or malaise.  No myalgias.  No feeling of throat closing up.  No recent exposure to chemicals/topical irritants, foods, or stings.  No NSAIDs. His migraines have been "normal" for him lately.  Had hives, unknown trigger, about 20 yrs ago and it resolved quickly with antihistamines and has not been a recurring thing.  Past Medical History:  Diagnosis Date  . Allergy    seasonal, constant  . Hypertension    Normal EKG in ED 06/09/2009  . Migraine with aura    was on low dose topamax in the past (Dr. Ron Parker).  Most recent neuro eval 05/08/13 by Dr. Hassell Done at [facility] in [place]--started [place] ER  500mg  qd, diclofenac 25mg  delayed release tabs--1 q6h prn HA, ordered MRI brain (NORMAL 05/08/13) and sleep evaluation.  . Nephrolithiasis   . Obesity, Class I, BMI 30-34.9   . OSA (obstructive sleep apnea)    uses cpap  . Palpitations 10/2014   Dr. Acie Fredrickson 11/2014; PVCs and PACs on Holter.      Past Surgical History:  Procedure Laterality Date  . ACHILLES TENDON LENGTHENING  42 yrs old  . CHOLECYSTECTOMY N/A 05/13/2015   Procedure: LAPAROSCOPIC CHOLECYSTECTOMY;  Surgeon: Coralie Keens, MD;  Location: [place];  Service: General;  Laterality: N/A;  . HERNIA REPAIR Right 1994   inguinal  . kidney stone removed  96   Dr. Risa Grill is urologist locally.  Marland Kitchen LITHOTRIPSY  1997 and 2005  . TYMPANOSTOMY TUBE PLACEMENT  1981  . VASECTOMY      Outpatient Medications Prior to Visit  Medication Sig Dispense Refill  . cetirizine (ZYRTEC) 10 MG tablet Take 10 mg by mouth daily.      . irbesartan (AVAPRO) 150 MG tablet Take 1 tablet (150 mg total) by mouth daily. 90 tablet 0  . tamsulosin (FLOMAX) 0.4 MG CAPS capsule Take 1 capsule (0.4 mg total) by mouth daily as needed (kidney stones). (Patient not taking: Reported on 05/03/2018) 30 capsule 1   No facility-administered medications prior to visit.  No Known Allergies  ROS As per HPI  PE: Blood pressure (!) 146/85, pulse 76, temperature 98.1 F (36.7 C), temperature source Oral, resp. rate 16, height 5\' 9"  (1.753 m), weight 225 lb 8 oz (102.3 kg), SpO2 99 %. Gen: Alert, well appearing.  Patient is oriented to person, place, time, and situation. AFFECT: pleasant, lucid thought and speech. YIR:SWNI: no injection, icteris, swelling, or exudate.  EOMI, PERRLA. Mouth: lips without lesion/swelling.  Oral mucosa pink and moist. Oropharynx without erythema, exudate, or swelling.  Neck - No masses or thyromegaly or limitation in range of motion CV: RRR, no m/r/g.   LUNGS: CTA bilat, nonlabored resps, good aeration in all lung fields. ABD: soft,  NT/ND EXT: no clubbing, cyanosis, or edema.  SKIN: he has many scattered 1-3 cm urticarial/hive lesions covering much of his back and abdomen and both axillae. A few on upper arms.  These plaques have no peeling/desquamation, and they do blanch with pressure.  No ecchymoses.  NO vesicles, nodules, or pustules.  Face, forearms, hands, legs---all clear. Joints: no erythema, swelling, warmth, or tenderness.  LABS:  none  IMPRESSION AND PLAN:  Urticaria x 6 wks-->suspect idiopathic vs postviral urticaria.  Would expect to postviral urticaria to resolve by now, though.  No angioedema. Unclear what the more severe episode was last night. Plan: Take 20mg  famotidine (pepcid) TWICE PER DAY. Take zyrtec 10mg  twice per day. You may take a 25 mg dose of benadryl once a day as needed. I've rx'd low dose prednisone taper: 10mg  qd x 7d, 5mg  qd x 7d, then 2.5mg  qd x 6d. He has already arranged an appointment with his allergist, Dr. Mickie Bail for 06/10/18.  An After Visit Summary was printed and given to the patient.  FOLLOW UP: Return for keep appt arranged for next week.  Make lab appt at your earliest convenience for fasting  bld work. Fasting labs ordered--future.  Signed:  Crissie Sickles, MD           05/03/2018

## 2018-05-06 ENCOUNTER — Encounter: Payer: Self-pay | Admitting: Family Medicine

## 2018-05-09 ENCOUNTER — Encounter: Payer: Self-pay | Admitting: *Deleted

## 2018-05-10 ENCOUNTER — Ambulatory Visit: Payer: BLUE CROSS/BLUE SHIELD | Admitting: Family Medicine

## 2018-05-10 ENCOUNTER — Encounter: Payer: Self-pay | Admitting: Family Medicine

## 2018-05-10 VITALS — BP 122/78 | HR 72 | Temp 98.2°F | Resp 16 | Ht 69.0 in | Wt 227.2 lb

## 2018-05-10 DIAGNOSIS — L509 Urticaria, unspecified: Secondary | ICD-10-CM | POA: Diagnosis not present

## 2018-05-10 DIAGNOSIS — Z0000 Encounter for general adult medical examination without abnormal findings: Secondary | ICD-10-CM | POA: Diagnosis not present

## 2018-05-10 DIAGNOSIS — E669 Obesity, unspecified: Secondary | ICD-10-CM

## 2018-05-10 DIAGNOSIS — G43109 Migraine with aura, not intractable, without status migrainosus: Secondary | ICD-10-CM | POA: Diagnosis not present

## 2018-05-10 DIAGNOSIS — I10 Essential (primary) hypertension: Secondary | ICD-10-CM

## 2018-05-10 LAB — CBC WITH DIFFERENTIAL/PLATELET
Basophils Absolute: 0.1 K/uL (ref 0.0–0.1)
Basophils Relative: 0.9 % (ref 0.0–3.0)
Eosinophils Absolute: 0.2 K/uL (ref 0.0–0.7)
Eosinophils Relative: 2.9 % (ref 0.0–5.0)
HCT: 43.4 % (ref 39.0–52.0)
Hemoglobin: 14.9 g/dL (ref 13.0–17.0)
Lymphocytes Relative: 25.5 % (ref 12.0–46.0)
Lymphs Abs: 1.7 K/uL (ref 0.7–4.0)
MCHC: 34.3 g/dL (ref 30.0–36.0)
MCV: 90 fl (ref 78.0–100.0)
Monocytes Absolute: 0.6 K/uL (ref 0.1–1.0)
Monocytes Relative: 9.1 % (ref 3.0–12.0)
Neutro Abs: 4.2 K/uL (ref 1.4–7.7)
Neutrophils Relative %: 61.6 % (ref 43.0–77.0)
Platelets: 173 K/uL (ref 150.0–400.0)
RBC: 4.82 Mil/uL (ref 4.22–5.81)
RDW: 14.4 % (ref 11.5–15.5)
WBC: 6.8 K/uL (ref 4.0–10.5)

## 2018-05-10 LAB — COMPREHENSIVE METABOLIC PANEL WITH GFR
ALT: 31 U/L (ref 0–53)
AST: 16 U/L (ref 0–37)
Albumin: 4.2 g/dL (ref 3.5–5.2)
Alkaline Phosphatase: 71 U/L (ref 39–117)
BUN: 20 mg/dL (ref 6–23)
CO2: 30 meq/L (ref 19–32)
Calcium: 9.2 mg/dL (ref 8.4–10.5)
Chloride: 102 meq/L (ref 96–112)
Creatinine, Ser: 1.15 mg/dL (ref 0.40–1.50)
GFR: 73.29 mL/min
Glucose, Bld: 87 mg/dL (ref 70–99)
Potassium: 4.9 meq/L (ref 3.5–5.1)
Sodium: 138 meq/L (ref 135–145)
Total Bilirubin: 0.6 mg/dL (ref 0.2–1.2)
Total Protein: 6.9 g/dL (ref 6.0–8.3)

## 2018-05-10 LAB — LIPID PANEL
CHOL/HDL RATIO: 5
Cholesterol: 192 mg/dL (ref 0–200)
HDL: 39.6 mg/dL (ref >39.00)
LDL CALC: 120 mg/dL — AB (ref 0–99)
NonHDL: 152.14
TRIGLYCERIDES: 162 mg/dL — AB (ref 0.0–149.0)
VLDL: 32.4 mg/dL (ref 0.0–40.0)

## 2018-05-10 LAB — TSH: TSH: 1.84 u[IU]/mL (ref 0.35–4.50)

## 2018-05-10 NOTE — Progress Notes (Signed)
OFFICE VISIT  05/10/2018   CC:  Chief Complaint  Patient presents with  . Follow-up    RCI, pt is fasting.    HPI:    Patient is a 42 y.o. Caucasian male who presents for 6 mo f/u HTN, migraine syndrome, recent urticarial rash (believed to be postviral).  Urticaria: I saw him last week for this.  I have him on high dose antihistamine, H2 blockers, and relatively low dose steroid taper.  He has appt for f/u with his allergist.  No signif change since I saw him last week. Tolerating meds that he is on now.  HTN: home monitoriing 150/90 lately since having urticaria.  Prior to that it was consistently 120s/70s.  HR 62 or so.  Migraines: about 1 migraine per week.  Ibuprofen helps minimally.  Time helps, otherwise no abortive med helps. No preventative med currently.  Topamax in the past helped diminish the intensity of them sometimes. He has had a normal brain MRI, has not followed up with neurologist lately. No trigger identified.    ROS: no CP, no SOB, no wheezing, no cough, no dizziness, no HAs,no melena/hematochezia.  No polyuria or polydipsia.  No myalgias or arthralgias.  Past Medical History:  Diagnosis Date  . Allergy    seasonal, constant  . Hypertension    Normal EKG in ED 06/09/2009  . Migraine with aura    was on low dose topamax in the past (Dr. Ron Parker).  Most recent neuro eval 05/08/13 by Dr. Hassell Done at [facility] in [place]--started [place] ER 500mg  qd, diclofenac 25mg  delayed release tabs--1 q6h prn HA, ordered MRI brain (NORMAL 05/08/13) and sleep evaluation.  . Nephrolithiasis   . Obesity, Class I, BMI 30-34.9   . OSA (obstructive sleep apnea)    uses cpap  . Palpitations 10/2014   Dr. Acie Fredrickson 11/2014; PVCs and PACs on Holter.      Past Surgical History:  Procedure Laterality Date  . ACHILLES TENDON LENGTHENING  42 yrs old  . CHOLECYSTECTOMY N/A 05/13/2015   Procedure: LAPAROSCOPIC CHOLECYSTECTOMY;  Surgeon: Coralie Keens, MD;  Location: [place];   Service: General;  Laterality: N/A;  . HERNIA REPAIR Right 1994   inguinal  . kidney stone removed  96   Dr. Risa Grill is urologist locally.  Marland Kitchen LITHOTRIPSY  1997 and 2005  . TYMPANOSTOMY TUBE PLACEMENT  1981  . VASECTOMY      Outpatient Medications Prior to Visit  Medication Sig Dispense Refill  . cetirizine (ZYRTEC) 10 MG tablet Take 10 mg by mouth daily.      . irbesartan (AVAPRO) 150 MG tablet Take 1 tablet (150 mg total) by mouth daily. 90 tablet 0  . predniSONE (DELTASONE) 10 MG tablet Take 10 mg by mouth daily.    . predniSONE (DELTASONE) 5 MG tablet 2 tabs po qd x 7d, then 1 tab po qd x 7d, then 1/2 tab po qd x 6 doses (Patient not taking: Reported on 05/10/2018) 24 tablet 0   No facility-administered medications prior to visit.     No Known Allergies  ROS As per HPI  PE: Blood pressure 122/78, pulse 72, temperature 98.2 F (36.8 C), temperature source Oral, resp. rate 16, height 5\' 9"  (1.753 m), weight 227 lb 4 oz (103.1 kg), SpO2 98 %. Gen: Alert, well appearing.  Patient is oriented to person, place, time, and situation. AFFECT: pleasant, lucid thought and speech. CV: RRR, no m/r/g.   LUNGS: CTA bilat, nonlabored resps, good aeration in all  lung fields. EXT: no clubbing, cyanosis, or edema.  Neuro: CN 2-12 intact bilaterally, strength 5/5 in proximal and distal upper extremities and lower extremities bilaterally.  No tremor.  No ataxia.    No pronator drift.   LABS:    Chemistry      Component Value Date/Time   NA 137 10/25/2017 0805   K 4.1 10/25/2017 0805   CL 101 10/25/2017 0805   CO2 28 10/25/2017 0805   BUN 17 10/25/2017 0805   CREATININE 0.99 10/25/2017 0805      Component Value Date/Time   CALCIUM 9.3 10/25/2017 0805   ALKPHOS 79 10/25/2017 0805   AST 19 10/25/2017 0805   ALT 24 10/25/2017 0805   BILITOT 0.7 10/25/2017 0805     Lab Results  Component Value Date   CHOL 184 10/25/2017   HDL 30.70 (L) 10/25/2017   LDLCALC 117 (H) 10/25/2017    LDLDIRECT 117.0 12/01/2014   TRIG 181.0 (H) 10/25/2017   CHOLHDL 6 10/25/2017   Lab Results  Component Value Date   TSH 2.47 10/25/2017   Lab Results  Component Value Date   WBC 6.4 10/25/2017   HGB 15.2 10/25/2017   HCT 44.6 10/25/2017   MCV 90.2 10/25/2017   PLT 206.0 10/25/2017     IMPRESSION AND PLAN:  1) HTN: well controlled except the last 6 weeks since having urticaria.  No changes at this time.  Continue monitoring. BMET today.  2) Urticaria: suspect idiopathic or postviral (occurring about 7 wks now): continue current meds and he's getting skin testing by his allergist in near future.  3) Migraine HAs: pretty stable.  Unfortunately he has not found an abortive med OR a preventative med that helps or that he can tolerate.  Neuro exam normal.  MRI brain normal 2014.  Fasting HP today for employer/insurer health monitoring form.  An After Visit Summary was printed and given to the patient.  FOLLOW UP: Return in about 6 months (around 11/10/2018) for annual CPE (fasting).  Signed:  Crissie Sickles, MD           05/10/2018

## 2018-05-13 ENCOUNTER — Encounter: Payer: Self-pay | Admitting: *Deleted

## 2018-06-14 DIAGNOSIS — J309 Allergic rhinitis, unspecified: Secondary | ICD-10-CM | POA: Diagnosis not present

## 2018-06-14 DIAGNOSIS — L509 Urticaria, unspecified: Secondary | ICD-10-CM | POA: Diagnosis not present

## 2018-06-18 ENCOUNTER — Encounter: Payer: Self-pay | Admitting: *Deleted

## 2018-06-20 ENCOUNTER — Encounter: Payer: Self-pay | Admitting: Family Medicine

## 2018-07-05 ENCOUNTER — Other Ambulatory Visit: Payer: Self-pay | Admitting: Family Medicine

## 2018-07-10 ENCOUNTER — Ambulatory Visit (INDEPENDENT_AMBULATORY_CARE_PROVIDER_SITE_OTHER): Payer: BLUE CROSS/BLUE SHIELD

## 2018-07-10 DIAGNOSIS — Z23 Encounter for immunization: Secondary | ICD-10-CM | POA: Diagnosis not present

## 2018-08-23 DIAGNOSIS — M25511 Pain in right shoulder: Secondary | ICD-10-CM | POA: Diagnosis not present

## 2018-08-23 DIAGNOSIS — M7581 Other shoulder lesions, right shoulder: Secondary | ICD-10-CM | POA: Diagnosis not present

## 2018-09-16 DIAGNOSIS — J209 Acute bronchitis, unspecified: Secondary | ICD-10-CM | POA: Diagnosis not present

## 2018-09-16 DIAGNOSIS — J069 Acute upper respiratory infection, unspecified: Secondary | ICD-10-CM | POA: Diagnosis not present

## 2018-09-16 DIAGNOSIS — I10 Essential (primary) hypertension: Secondary | ICD-10-CM | POA: Diagnosis not present

## 2018-09-25 DIAGNOSIS — F321 Major depressive disorder, single episode, moderate: Secondary | ICD-10-CM | POA: Diagnosis not present

## 2018-09-25 DIAGNOSIS — J411 Mucopurulent chronic bronchitis: Secondary | ICD-10-CM | POA: Diagnosis not present

## 2018-10-01 ENCOUNTER — Other Ambulatory Visit: Payer: Self-pay

## 2018-10-01 ENCOUNTER — Emergency Department (HOSPITAL_BASED_OUTPATIENT_CLINIC_OR_DEPARTMENT_OTHER)
Admission: EM | Admit: 2018-10-01 | Discharge: 2018-10-01 | Disposition: A | Discharge status: Home / Self Care | Payer: BLUE CROSS/BLUE SHIELD | Attending: Emergency Medicine | Admitting: Emergency Medicine

## 2018-10-01 ENCOUNTER — Encounter (HOSPITAL_BASED_OUTPATIENT_CLINIC_OR_DEPARTMENT_OTHER): Payer: Self-pay | Admitting: Emergency Medicine

## 2018-10-01 ENCOUNTER — Emergency Department (HOSPITAL_BASED_OUTPATIENT_CLINIC_OR_DEPARTMENT_OTHER): Payer: BLUE CROSS/BLUE SHIELD

## 2018-10-01 DIAGNOSIS — I10 Essential (primary) hypertension: Secondary | ICD-10-CM | POA: Diagnosis not present

## 2018-10-01 DIAGNOSIS — Z9049 Acquired absence of other specified parts of digestive tract: Secondary | ICD-10-CM | POA: Insufficient documentation

## 2018-10-01 DIAGNOSIS — R079 Chest pain, unspecified: Secondary | ICD-10-CM | POA: Diagnosis not present

## 2018-10-01 DIAGNOSIS — Z87891 Personal history of nicotine dependence: Secondary | ICD-10-CM | POA: Insufficient documentation

## 2018-10-01 DIAGNOSIS — R072 Precordial pain: Secondary | ICD-10-CM | POA: Diagnosis not present

## 2018-10-01 DIAGNOSIS — Z79899 Other long term (current) drug therapy: Secondary | ICD-10-CM | POA: Diagnosis not present

## 2018-10-01 LAB — CBC
HCT: 43.1 % (ref 39.0–52.0)
Hemoglobin: 14.6 g/dL (ref 13.0–17.0)
MCH: 29.7 pg (ref 26.0–34.0)
MCHC: 33.9 g/dL (ref 30.0–36.0)
MCV: 87.8 fL (ref 80.0–100.0)
PLATELETS: 222 10*3/uL (ref 150–400)
RBC: 4.91 MIL/uL (ref 4.22–5.81)
RDW: 12.8 % (ref 11.5–15.5)
WBC: 5.3 10*3/uL (ref 4.0–10.5)
nRBC: 0 % (ref 0.0–0.2)

## 2018-10-01 LAB — HEPATIC FUNCTION PANEL
ALT: 21 U/L (ref 0–44)
AST: 21 U/L (ref 15–41)
Albumin: 4.2 g/dL (ref 3.5–5.0)
Alkaline Phosphatase: 79 U/L (ref 38–126)
BILIRUBIN INDIRECT: 0.6 mg/dL (ref 0.3–0.9)
Bilirubin, Direct: 0.1 mg/dL (ref 0.0–0.2)
Total Bilirubin: 0.7 mg/dL (ref 0.3–1.2)
Total Protein: 7.3 g/dL (ref 6.5–8.1)

## 2018-10-01 LAB — BASIC METABOLIC PANEL
Anion gap: 6 (ref 5–15)
BUN: 19 mg/dL (ref 6–20)
CO2: 25 mmol/L (ref 22–32)
CREATININE: 0.89 mg/dL (ref 0.61–1.24)
Calcium: 9.1 mg/dL (ref 8.9–10.3)
Chloride: 105 mmol/L (ref 98–111)
GFR calc Af Amer: >60 mL/min (ref >60)
GFR calc non Af Amer: >60 mL/min (ref >60)
Glucose, Bld: 95 mg/dL (ref 70–99)
Potassium: 4.2 mmol/L (ref 3.5–5.1)
Sodium: 136 mmol/L (ref 135–145)

## 2018-10-01 LAB — TROPONIN I: Troponin I: <0.03 ng/mL (ref <0.03)

## 2018-10-01 LAB — D-DIMER, QUANTITATIVE (NOT AT ARMC): D DIMER QUANT: 0.48 ug{FEU}/mL (ref 0.00–0.50)

## 2018-10-01 LAB — VITAMIN B12: Vitamin B-12: 678 pg/mL (ref 180–914)

## 2018-10-01 MED ORDER — ALBUTEROL SULFATE 108 (90 BASE) MCG/ACT IN AEPB: 2.0000 | INHALATION_SPRAY | RESPIRATORY_TRACT | 0 refills | Status: DC | PRN

## 2018-10-01 MED ORDER — PREDNISONE 20 MG PO TABS: 40.0000 mg | ORAL_TABLET | Freq: Every day | ORAL | 0 refills | Status: DC

## 2018-10-01 MED ORDER — SODIUM CHLORIDE 0.9 % IV BOLUS
1000.0000 mL | Freq: Once | INTRAVENOUS | Status: AC
  Administered 2018-10-01: 1000 mL via INTRAVENOUS

## 2018-10-01 NOTE — ED Provider Notes (Signed)
[place] EMERGENCY DEPARTMENT Provider Note   CSN: [id number] Arrival date & time: 10/01/18  5170     History   Chief Complaint Chief Complaint  Patient presents with  . Chest Pain    HPI Steve Salinas is a 42 y.o. male.  HPI Patient is a 42 year old male presents the emergency department with some mild chest discomfort and tightness in his chest today.  No shortness of breath.  No fevers or chills.  No nausea or vomiting.  There is a family history of early cardiac disease in his family.  There is no significant pleuritic pain.  No symptoms similar to this in the past.  Symptoms are mild in severity.  No unilateral leg swelling.  No history of DVT or pulmonary embolism.  No recent travel or surgery.   Past Medical History:  Diagnosis Date  . Allergic rhinitis   . Allergy    seasonal, constant  . Hives 2019   Viral exanthem/postviral urticaria suspected.  . Hypertension    Normal EKG in ED 06/09/2009  . Migraine with aura    was on low dose topamax in the past (Dr. Ron Parker).  Most recent neuro eval 05/08/13 by Dr. Hassell Done at [facility] in [place]--started [place] ER 500mg  qd, diclofenac 25mg  delayed release tabs--1 q6h prn HA, ordered MRI brain (NORMAL 05/08/13) and sleep evaluation.  . Nephrolithiasis   . Obesity, Class I, BMI 30-34.9   . OSA (obstructive sleep apnea)    uses cpap  . Palpitations 10/2014   Dr. Acie Fredrickson 11/2014; PVCs and PACs on Holter.      Patient Active Problem List   Diagnosis Date Noted  . Palpitations 12/02/2014  . Multiple melanocytic nevi 09/26/2013  . Health maintenance examination 07/11/2013  . Headache, classical migraine 06/12/2011  . HTN (hypertension), benign 04/03/2011    Past Surgical History:  Procedure Laterality Date  . ACHILLES TENDON LENGTHENING  42 yrs old  . CHOLECYSTECTOMY N/A 05/13/2015   Procedure: LAPAROSCOPIC CHOLECYSTECTOMY;  Surgeon: Coralie Keens, MD;  Location: [place];  Service: General;   Laterality: N/A;  . HERNIA REPAIR Right 1994   inguinal  . kidney stone removed  96   Dr. Risa Grill is urologist locally.  Marland Kitchen LITHOTRIPSY  1997 and 2005  . TYMPANOSTOMY TUBE PLACEMENT  1981  . VASECTOMY          Home Medications    Prior to Admission medications   Medication Sig Start Date End Date Taking? Authorizing Provider  DOXYCYCLINE PO Take by mouth.   Yes [provider]  Escitalopram Oxalate (LEXAPRO PO) Take by mouth.   Yes [provider]  cetirizine (ZYRTEC) 10 MG tablet Take 10 mg by mouth daily.      [provider]  irbesartan (AVAPRO) 150 MG tablet TAKE 1 TABLET DAILY 07/05/18   McGowen, Adrian Blackwater, MD  predniSONE (DELTASONE) 10 MG tablet Take 10 mg by mouth daily.    [provider]    Family History Family History  Problem Relation Age of Onset  . Hypertension Mother   . Hyperlipidemia Mother   . Arthritis Mother   . Heart attack Mother   . Arthritis Father   . Hyperlipidemia Father   . Hypertension Father   . Heart disease Father        stents  . Hypertension Maternal Grandmother   . Heart attack Maternal Grandfather     Social History Social History   Tobacco Use  . Smoking status: Former Smoker  Packs/day: 1.00    Years: 11.00    Pack years: 11.00    Types: Cigarettes    Last attempt to quit: 10/23/1998    Years since quitting: 19.9  . Smokeless tobacco: Never Used  Substance Use Topics  . Alcohol use: No  . Drug use: No     Allergies   Patient has no known allergies.   Review of Systems Review of Systems  All other systems reviewed and are negative.    Physical Exam Updated Vital Signs BP (!) 168/95 (BP Location: Left Arm)   Pulse 73   Temp 98.8 F (37.1 C) (Oral)   Resp 16   Ht 5\' 8"  (1.727 m)   Wt 90.7 kg   SpO2 98%   BMI 30.41 kg/m   Physical Exam Vitals signs and nursing note reviewed.  Constitutional:      Appearance: He is well-developed.  HENT:     Head: Normocephalic and  atraumatic.  Neck:     Musculoskeletal: Normal range of motion.  Cardiovascular:     Rate and Rhythm: Normal rate and regular rhythm.     Heart sounds: Normal heart sounds.  Pulmonary:     Effort: Pulmonary effort is normal. No respiratory distress.     Breath sounds: Normal breath sounds.  Abdominal:     General: There is no distension.     Palpations: Abdomen is soft.     Tenderness: There is no abdominal tenderness.  Musculoskeletal: Normal range of motion.  Skin:    General: Skin is warm and dry.  Neurological:     Mental Status: He is alert and oriented to person, place, and time.  Psychiatric:        Judgment: Judgment normal.      ED Treatments / Results  Labs (all labs ordered are listed, but only abnormal results are displayed) Labs Reviewed  VITAMIN D 25 HYDROXY (VIT D DEFICIENCY, FRACTURES) - Abnormal; Notable for the following components:      Result Value   Vit D, 25-Hydroxy 25.8 (*)    All other components within normal limits  BASIC METABOLIC PANEL  CBC  TROPONIN I  HEPATIC FUNCTION PANEL  D-DIMER, QUANTITATIVE (NOT AT [facility])  VITAMIN B12    EKG EKG Interpretation  Date/Time:  Tuesday October 01 2018 09:55:30 EST Ventricular Rate:  72 PR Interval:  138 QRS Duration: 84 QT Interval:  394 QTC Calculation: 431 R Axis:   67 Text Interpretation:  Normal sinus rhythm Normal ECG No significant change was found No significant change was found Reconfirmed by Jola Schmidt [phone]) on 10/01/2018 12:37:26 PM   Radiology Dg Chest 2 View  Result Date: 10/01/2018 CLINICAL DATA:  Chest pain. EXAM: CHEST - 2 VIEW COMPARISON:  None. FINDINGS: The heart size and mediastinal contours are within normal limits. Both lungs are clear. The visualized skeletal structures are unremarkable. IMPRESSION: Normal exam. Electronically Signed   By: Lorriane Shire M.D.   On: 10/01/2018 10:13    Procedures Procedures (including critical care time)  Medications Ordered in  ED Medications  sodium chloride 0.9 % bolus 1,000 mL (1,000 mLs Intravenous New Bag/Given 10/01/18 1258)     Initial Impression / Assessment and Plan / ED Course  I have reviewed the triage vital signs and the nursing notes.  Pertinent labs & imaging results that were available during my care of the patient were reviewed by me and considered in my medical decision making (see chart for details).  Work-up in the emergency department is without significant abnormalities.  Low suspicion for ACS.  Stable for discharge.  D-dimer is negative.  Atypical chest pain.  Primary care follow-up.  Outpatient cardiology follow-up.  May benefit from provocative testing.  Patient encouraged to return to the emergency department for new or worsening symptoms  Final Clinical Impressions(s) / ED Diagnoses   Final diagnoses:  Precordial chest pain    ED Discharge Orders    None       Jola Schmidt, MD 10/04/18 1132

## 2018-10-01 NOTE — ED Triage Notes (Signed)
Reports chest pain which began today.  C/o vomiting since last week.  Reports chest pain as a tightness with left arm pain.  Denies shortness of breath.

## 2018-10-02 LAB — VITAMIN D 25 HYDROXY (VIT D DEFICIENCY, FRACTURES): Vit D, 25-Hydroxy: 25.8 ng/mL — ABNORMAL LOW (ref 30.0–100.0)

## 2018-10-07 DIAGNOSIS — R1111 Vomiting without nausea: Secondary | ICD-10-CM | POA: Diagnosis not present

## 2018-10-07 DIAGNOSIS — R634 Abnormal weight loss: Secondary | ICD-10-CM | POA: Diagnosis not present

## 2018-10-07 DIAGNOSIS — R194 Change in bowel habit: Secondary | ICD-10-CM | POA: Diagnosis not present

## 2018-10-07 DIAGNOSIS — R05 Cough: Secondary | ICD-10-CM | POA: Diagnosis not present

## 2018-10-10 ENCOUNTER — Encounter: Payer: Self-pay | Admitting: *Deleted

## 2018-10-10 DIAGNOSIS — R1111 Vomiting without nausea: Secondary | ICD-10-CM | POA: Diagnosis not present

## 2018-10-10 DIAGNOSIS — K319 Disease of stomach and duodenum, unspecified: Secondary | ICD-10-CM | POA: Diagnosis not present

## 2018-10-10 DIAGNOSIS — K2950 Unspecified chronic gastritis without bleeding: Secondary | ICD-10-CM | POA: Diagnosis not present

## 2018-10-10 DIAGNOSIS — K2990 Gastroduodenitis, unspecified, without bleeding: Secondary | ICD-10-CM | POA: Diagnosis not present

## 2018-10-10 DIAGNOSIS — K2970 Gastritis, unspecified, without bleeding: Secondary | ICD-10-CM

## 2018-10-10 DIAGNOSIS — R112 Nausea with vomiting, unspecified: Secondary | ICD-10-CM | POA: Diagnosis not present

## 2018-10-10 DIAGNOSIS — R1011 Right upper quadrant pain: Secondary | ICD-10-CM | POA: Diagnosis not present

## 2018-10-10 HISTORY — DX: Gastroduodenitis, unspecified, without bleeding: K29.90

## 2018-10-10 HISTORY — DX: Gastritis, unspecified, without bleeding: K29.70

## 2018-10-10 HISTORY — PX: UPPER GI ENDOSCOPY: SHX6162

## 2018-10-29 ENCOUNTER — Encounter: Payer: Self-pay | Admitting: Family Medicine

## 2018-12-11 DIAGNOSIS — J029 Acute pharyngitis, unspecified: Secondary | ICD-10-CM | POA: Diagnosis not present

## 2018-12-11 DIAGNOSIS — I10 Essential (primary) hypertension: Secondary | ICD-10-CM | POA: Diagnosis not present

## 2018-12-15 DIAGNOSIS — J039 Acute tonsillitis, unspecified: Secondary | ICD-10-CM | POA: Diagnosis not present

## 2018-12-15 DIAGNOSIS — H1031 Unspecified acute conjunctivitis, right eye: Secondary | ICD-10-CM | POA: Diagnosis not present

## 2019-01-01 ENCOUNTER — Other Ambulatory Visit: Payer: Self-pay | Admitting: Family Medicine

## 2019-01-10 DIAGNOSIS — M25572 Pain in left ankle and joints of left foot: Secondary | ICD-10-CM | POA: Diagnosis not present

## 2019-01-10 DIAGNOSIS — M71572 Other bursitis, not elsewhere classified, left ankle and foot: Secondary | ICD-10-CM | POA: Diagnosis not present

## 2019-01-10 DIAGNOSIS — M79671 Pain in right foot: Secondary | ICD-10-CM | POA: Diagnosis not present

## 2019-01-10 DIAGNOSIS — M79672 Pain in left foot: Secondary | ICD-10-CM | POA: Diagnosis not present

## 2019-01-10 DIAGNOSIS — M7731 Calcaneal spur, right foot: Secondary | ICD-10-CM | POA: Diagnosis not present

## 2019-01-10 DIAGNOSIS — M7732 Calcaneal spur, left foot: Secondary | ICD-10-CM | POA: Diagnosis not present

## 2019-01-10 DIAGNOSIS — M25571 Pain in right ankle and joints of right foot: Secondary | ICD-10-CM | POA: Diagnosis not present

## 2019-01-27 DIAGNOSIS — M7662 Achilles tendinitis, left leg: Secondary | ICD-10-CM | POA: Diagnosis not present

## 2019-01-27 DIAGNOSIS — M71572 Other bursitis, not elsewhere classified, left ankle and foot: Secondary | ICD-10-CM | POA: Diagnosis not present

## 2019-02-06 DIAGNOSIS — M722 Plantar fascial fibromatosis: Secondary | ICD-10-CM | POA: Diagnosis not present

## 2019-02-13 DIAGNOSIS — C44329 Squamous cell carcinoma of skin of other parts of face: Secondary | ICD-10-CM | POA: Diagnosis not present

## 2019-02-26 ENCOUNTER — Encounter: Payer: Self-pay | Admitting: Family Medicine

## 2019-02-26 ENCOUNTER — Other Ambulatory Visit: Payer: Self-pay

## 2019-02-26 ENCOUNTER — Ambulatory Visit (INDEPENDENT_AMBULATORY_CARE_PROVIDER_SITE_OTHER): Payer: BLUE CROSS/BLUE SHIELD | Admitting: Family Medicine

## 2019-02-26 VITALS — BP 120/81 | HR 62 | Wt 220.0 lb

## 2019-02-26 DIAGNOSIS — I10 Essential (primary) hypertension: Secondary | ICD-10-CM

## 2019-02-26 DIAGNOSIS — G5601 Carpal tunnel syndrome, right upper limb: Secondary | ICD-10-CM | POA: Diagnosis not present

## 2019-02-26 DIAGNOSIS — G43909 Migraine, unspecified, not intractable, without status migrainosus: Secondary | ICD-10-CM | POA: Diagnosis not present

## 2019-02-26 NOTE — Progress Notes (Signed)
Virtual Visit via Video Note  I connected with pt on 02/26/19 at 11:00 AM EDT by a video enabled telemedicine application and verified that I am speaking with the correct person using two identifiers.  Location patient: home Location provider:work or home office Persons participating in the virtual visit: patient, provider  I discussed the limitations of evaluation and management by telemedicine and the availability of in person appointments. The patient expressed understanding and agreed to proceed.  Telemedicine visit is a necessity given the COVID-19 restrictions in place at the current time.  HPI: 43 y/o WM being seen today for f/u HTN and migraine syndrome. 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 Will likely look elsewhere for work in the not too distant future. Left foot/ankle pain and stiffness has recurred lately, went to podiatrist and got a steroid injection into his ankle and this worked WONDERS for him.  Last few months he notes right elbow and hand sensory changes (mostly tingling in fingers ->all), notes numbness more prominent with fine motor movements.  Has some popping of R hand finger joints that he feels, mild/low grade pain with this.  He is right handed.  R hand feels "tired" at times.  No color changes in arm/hand. No preceding injury.  Has not tried any treatments.  HTN: home bp numbers all normal.  Migraines: these have not been an issue for him at all the last 6 months. He still doesn't really know what his triggers are.  ROS: no CP, no SOB, no wheezing, no cough, no dizziness, no HAs, no rashes, no melena/hematochezia.  No polyuria or polydipsia.  No myalgias or arthralgias.   Past Medical History:  Diagnosis Date  . Allergic rhinitis   . Allergy    seasonal, constant  . Gastritis and gastroduodenitis 10/10/2018   Bx-->reactive gastropathic changes in a background of chronic gastritis.  H pylori NEG.   . Hives 2019   Viral exanthem/postviral urticaria suspected.  . Hypertension    Normal EKG in ED 06/09/2009  . Migraine with aura    was on low dose topamax in the past (Dr. Ron Parker).  Most recent neuro eval 05/08/13 by Dr. Hassell Done at [facility] in [place]--started [place] ER 500mg  qd, diclofenac 25mg  delayed release tabs--1 q6h prn HA, ordered MRI brain (NORMAL 05/08/13) and sleep evaluation.  . Nephrolithiasis   . Obesity, Class I, BMI 30-34.9   . OSA (obstructive sleep apnea)    uses cpap  . Palpitations 10/2014   Dr. Acie Fredrickson 11/2014; PVCs and PACs on Holter.      Past Surgical History:  Procedure Laterality Date  . ACHILLES TENDON LENGTHENING  43 yrs old  . CHOLECYSTECTOMY N/A 05/13/2015   Procedure: LAPAROSCOPIC CHOLECYSTECTOMY;  Surgeon: Coralie Keens, MD;  Location: [place];  Service: General;  Laterality: N/A;  . HERNIA REPAIR Right 1994   inguinal  . kidney stone removed  96   Dr. Risa Grill is urologist locally.  Marland Kitchen LITHOTRIPSY  1997 and 2005  . TYMPANOSTOMY TUBE PLACEMENT  1981  . UPPER GI ENDOSCOPY  10/10/2018  . VASECTOMY      Family History  Problem Relation Age of Onset  . Hypertension Mother   . Hyperlipidemia Mother   . Arthritis Mother   . Heart attack Mother   . Arthritis Father   . Hyperlipidemia Father   . Hypertension Father   . Heart disease Father        stents  .  Hypertension Maternal Grandmother   . Heart attack Maternal Grandfather     SOC hx: Married, 3 bio children, 1 adopted.   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    Current Outpatient Medications:  .  cetirizine (ZYRTEC) 10 MG tablet, Take 10 mg by mouth daily.  , Disp: , Rfl:  .  irbesartan (AVAPRO) 150 MG tablet, Take 1 tablet (150 mg total) by mouth daily. OFFICE VISIT NEEDED, Disp: 90 tablet, Rfl: 0  EXAM:  VITALS per patient if applicable: BP 008/67 (BP Location: Left Arm, Patient Position: Sitting, Cuff Size: Large)   Pulse 62   Wt 220 lb (99.8 kg)   BMI 33.45 kg/m     GENERAL: alert, oriented, appears well and in no acute distress  HEENT: atraumatic, conjunttiva clear, no obvious abnormalities on inspection of external nose and ears  NECK: normal movements of the head and neck  LUNGS: on inspection no signs of respiratory distress, breathing rate appears normal, no obvious gross SOB, gasping or wheezing  CV: no obvious cyanosis  MS: moves all visible extremities without noticeable abnormality I had him to phalen's test and tinel's testing on R wrist and these were negative.  PSYCH/NEURO: pleasant and cooperative, no obvious depression or anxiety, speech and thought processing grossly intact  LABS: none today    Chemistry      Component Value Date/Time   NA 136 10/01/2018 1208   K 4.2 10/01/2018 1208   CL 105 10/01/2018 1208   CO2 25 10/01/2018 1208   BUN 19 10/01/2018 1208   CREATININE 0.89 10/01/2018 1208      Component Value Date/Time   CALCIUM 9.1 10/01/2018 1208   ALKPHOS 79 10/01/2018 1208   AST 21 10/01/2018 1208   ALT 21 10/01/2018 1208   BILITOT 0.7 10/01/2018 1208     Lab Results  Component Value Date   CHOL 192 05/10/2018   HDL 39.60 05/10/2018   LDLCALC 120 (H) 05/10/2018   LDLDIRECT 117.0 12/01/2014   TRIG 162.0 (H) 05/10/2018   CHOLHDL 5 05/10/2018   Lab Results  Component Value Date   TSH 1.84 05/10/2018   Lab Results  Component Value Date   WBC 5.3 10/01/2018   HGB 14.6 10/01/2018   HCT 43.1 10/01/2018   MCV 87.8 10/01/2018   PLT 222 10/01/2018   ASSESSMENT AND PLAN:  Discussed the following assessment and plan:  1) HTN: The current medical regimen is effective;  continue present plan and medications. Kidney function and lytes have been stable the last few checks, so we'll plan on doing these again in 6 mo.  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 Discussed trial of wrist splint (particularly hs), relative rest from fine motor  activities whenever possible, monitor sx's and call/return if not improving in several weeks.  3) Migraine syndrome: not affecting him much at all the last 6 mo. Discussed the fact that he would be a candidate for CGRP receptor blocker if migraines become a problem for him again in the future-->he has tried and failed many prophylactic meds and also all the traditional abortive migraine meds in the past.   I discussed the assessment and treatment plan with the patient. The patient was provided an opportunity to ask questions and all were answered. The patient agreed with the plan and demonstrated an understanding of the instructions.   The patient was advised to call back or seek an in-person evaluation if the symptoms worsen  or if the condition fails to improve as anticipated.  F/u: 6 mo cpe or f/u htn  Signed:  Crissie Sickles, MD           02/26/2019

## 2019-03-05 DIAGNOSIS — C44329 Squamous cell carcinoma of skin of other parts of face: Secondary | ICD-10-CM | POA: Diagnosis not present

## 2019-03-12 DIAGNOSIS — L570 Actinic keratosis: Secondary | ICD-10-CM | POA: Diagnosis not present

## 2019-03-28 DIAGNOSIS — M722 Plantar fascial fibromatosis: Secondary | ICD-10-CM | POA: Diagnosis not present

## 2019-03-28 DIAGNOSIS — M71572 Other bursitis, not elsewhere classified, left ankle and foot: Secondary | ICD-10-CM | POA: Diagnosis not present

## 2019-03-28 DIAGNOSIS — M7662 Achilles tendinitis, left leg: Secondary | ICD-10-CM | POA: Diagnosis not present

## 2019-04-01 ENCOUNTER — Other Ambulatory Visit: Payer: Self-pay | Admitting: Family Medicine

## 2019-04-07 DIAGNOSIS — M25572 Pain in left ankle and joints of left foot: Secondary | ICD-10-CM | POA: Diagnosis not present

## 2019-04-07 DIAGNOSIS — M25571 Pain in right ankle and joints of right foot: Secondary | ICD-10-CM | POA: Diagnosis not present

## 2019-04-29 ENCOUNTER — Encounter: Payer: Self-pay | Admitting: Family Medicine

## 2019-04-29 DIAGNOSIS — I10 Essential (primary) hypertension: Secondary | ICD-10-CM

## 2019-04-29 DIAGNOSIS — E669 Obesity, unspecified: Secondary | ICD-10-CM

## 2019-04-30 NOTE — Telephone Encounter (Signed)
Can use bp and wt from his last visit on 02/26/19.   I'll put in order for future cholesterol panel and glucose level and he can make appt for fasting labs.-thx

## 2019-05-08 ENCOUNTER — Ambulatory Visit (INDEPENDENT_AMBULATORY_CARE_PROVIDER_SITE_OTHER): Payer: BC Managed Care – PPO | Admitting: Family Medicine

## 2019-05-08 ENCOUNTER — Other Ambulatory Visit: Payer: Self-pay

## 2019-05-08 DIAGNOSIS — E669 Obesity, unspecified: Secondary | ICD-10-CM | POA: Diagnosis not present

## 2019-05-08 DIAGNOSIS — I10 Essential (primary) hypertension: Secondary | ICD-10-CM

## 2019-05-08 LAB — LIPID PANEL
Cholesterol: 167 mg/dL (ref 0–200)
HDL: 31.2 mg/dL — ABNORMAL LOW (ref >39.00)
LDL Cholesterol: 103 mg/dL — ABNORMAL HIGH (ref 0–99)
NonHDL: 135.35
Total CHOL/HDL Ratio: 5
Triglycerides: 164 mg/dL — ABNORMAL HIGH (ref 0.0–149.0)
VLDL: 32.8 mg/dL (ref 0.0–40.0)

## 2019-05-08 LAB — GLUCOSE, RANDOM: Glucose, Bld: 83 mg/dL (ref 70–99)

## 2019-05-09 ENCOUNTER — Telehealth: Payer: Self-pay

## 2019-05-09 NOTE — Telephone Encounter (Signed)
MyChart message sent regarding health form.

## 2019-05-09 NOTE — Telephone Encounter (Signed)
Signed form and gave back to you.

## 2019-05-09 NOTE — Telephone Encounter (Signed)
Pt would like to pick up form when completed.

## 2019-05-09 NOTE — Telephone Encounter (Signed)
Received pt's form. Form is on my desk for completion. Will give form to PCP for signature.

## 2019-05-09 NOTE — Telephone Encounter (Signed)
Patient has received and read MyChart message. Form placed up front for pick up.

## 2019-06-30 ENCOUNTER — Other Ambulatory Visit: Payer: Self-pay | Admitting: Family Medicine

## 2019-07-18 ENCOUNTER — Ambulatory Visit (INDEPENDENT_AMBULATORY_CARE_PROVIDER_SITE_OTHER): Payer: BC Managed Care – PPO

## 2019-07-18 ENCOUNTER — Other Ambulatory Visit: Payer: Self-pay

## 2019-07-18 DIAGNOSIS — Z23 Encounter for immunization: Secondary | ICD-10-CM

## 2019-07-28 DIAGNOSIS — M542 Cervicalgia: Secondary | ICD-10-CM | POA: Diagnosis not present

## 2019-07-28 DIAGNOSIS — M25511 Pain in right shoulder: Secondary | ICD-10-CM | POA: Diagnosis not present

## 2019-08-31 ENCOUNTER — Other Ambulatory Visit: Payer: Self-pay | Admitting: Family Medicine

## 2019-09-03 ENCOUNTER — Other Ambulatory Visit: Payer: Self-pay

## 2019-09-05 ENCOUNTER — Encounter: Payer: Self-pay | Admitting: Family Medicine

## 2019-09-05 ENCOUNTER — Ambulatory Visit (INDEPENDENT_AMBULATORY_CARE_PROVIDER_SITE_OTHER): Payer: BC Managed Care – PPO | Admitting: Family Medicine

## 2019-09-05 ENCOUNTER — Other Ambulatory Visit: Payer: Self-pay

## 2019-09-05 VITALS — BP 128/71 | HR 71

## 2019-09-05 DIAGNOSIS — G43909 Migraine, unspecified, not intractable, without status migrainosus: Secondary | ICD-10-CM | POA: Diagnosis not present

## 2019-09-05 DIAGNOSIS — I10 Essential (primary) hypertension: Secondary | ICD-10-CM

## 2019-09-05 NOTE — Progress Notes (Signed)
Virtual Visit via Video Note  I connected with pt on 09/05/19 at  8:00 AM EST by a video enabled telemedicine application and verified that I am speaking with the correct person using two identifiers.  Location patient: home Location provider:work or home office Persons participating in the virtual visit: patient, provider  I discussed the limitations of evaluation and management by telemedicine and the availability of in person appointments. The patient expressed understanding and agreed to proceed.  Telemedicine visit is a necessity given the COVID-19 restrictions in place at the current time.  HPI: 43 y/o WM being seen today for f/u HTN.  Doing well.  HTN: avg 128/81 Taking ibup+famotidine prn for bilat feet pain.  Migraines: infrequent, mostly "mild", rare severe. Not really taking abortive med much b/c they don't help much and they make his stomach hurt. Unknown trigger.   ROS: See pertinent positives and negatives per HPI.  Past Medical History:  Diagnosis Date  . Allergic rhinitis   . Allergy    seasonal, constant  . Gastritis and gastroduodenitis 10/10/2018   Bx-->reactive gastropathic changes in a background of chronic gastritis.  H pylori NEG.  . Hives 2019   Viral exanthem/postviral urticaria suspected.  . Hypertension    Normal EKG in ED 06/09/2009  . Migraine with aura    was on low dose topamax in the past (Dr. Ron Parker).  Most recent neuro eval 05/08/13 by Dr. Hassell Done at [facility] [place]--started [place] ER 500mg  qd, diclofenac 25mg  delayed release tabs--1 q6h prn HA, ordered MRI brain (NORMAL 05/08/13) and sleep evaluation.  . Nephrolithiasis   . Obesity, Class I, BMI 30-34.9   . OSA (obstructive sleep apnea)    uses cpap  . Palpitations 10/2014   Dr. Acie Fredrickson 11/2014; PVCs and PACs on Holter.      Past Surgical History:  Procedure Laterality Date  . ACHILLES TENDON LENGTHENING  43 yrs old  . CHOLECYSTECTOMY N/A 05/13/2015   Procedure:  LAPAROSCOPIC CHOLECYSTECTOMY;  Surgeon: Coralie Keens, MD;  Location: [facility];  Service: General;  Laterality: N/A;  . HERNIA REPAIR Right 1994   inguinal  . kidney stone removed  96   Dr. Risa Grill is urologist locally.  Marland Kitchen LITHOTRIPSY  1997 and 2005  . TYMPANOSTOMY TUBE PLACEMENT  1981  . UPPER GI ENDOSCOPY  10/10/2018  . VASECTOMY     Social History   Socioeconomic History  . Marital status: Married    Spouse name: Not on file  . Number of children: Not on file  . Years of education: Not on file  . Highest education level: Not on file  Occupational History  . Not on file  Social Needs  . Financial resource strain: Not on file  . Food insecurity    Worry: Not on file    Inability: Not on file  . Transportation needs    Medical: Not on file    Non-medical: Not on file  Tobacco Use  . Smoking status: Former Smoker    Packs/day: 1.00    Years: 11.00    Pack years: 11.00    Types: Cigarettes    Quit date: 10/23/1998    Years since quitting: 20.8  . Smokeless tobacco: Never Used  Substance and Sexual Activity  . Alcohol use: No  . Drug use: No  . Sexual activity: Not on file  Lifestyle  . Physical activity    Days per week: Not on file    Minutes per session: Not on file  .  Stress: Not on file  Relationships  . Social Herbalist on phone: Not on file    Gets together: Not on file    Attends religious service: Not on file    Active member of club or organization: Not on file    Attends meetings of clubs or organizations: Not on file    Relationship status: Not on file  Other Topics Concern  . Not on file  Social History Narrative   Married, 4 young children (3 natural, 1 adopted from [place]).   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   Originally from [place].  No tobacco, alc, or drugs.   No formal exercise but works in yard daily.   No OTC decongestants.  16 oz coffee/day.    Family History  Problem Relation Age of Onset  .  Hypertension Mother   . Hyperlipidemia Mother   . Arthritis Mother   . Heart attack Mother   . Arthritis Father   . Hyperlipidemia Father   . Hypertension Father   . Heart disease Father        stents  . Hypertension Maternal Grandmother   . Heart attack Maternal Grandfather      Current Outpatient Medications:  .  cetirizine (ZYRTEC) 10 MG tablet, Take 10 mg by mouth daily.  , Disp: , Rfl:  .  DUEXIS 800-26.6 MG TABS, Take 1 tablet by mouth 3 (three) times daily., Disp: , Rfl:  .  fluticasone (FLONASE) 50 MCG/ACT nasal spray, SPRAY ONE SPRAY BY BOTH NOSTRILS ROUTE DAILY., Disp: , Rfl:  .  irbesartan (AVAPRO) 150 MG tablet, TAKE 1 TABLET DAILY (OFFICE VISIT NEEDED), Disp: 90 tablet, Rfl: 0 Duoxis: ibup + famotidine  EXAM:  VITALS per patient if applicable:  GENERAL: alert, oriented, appears well and in no acute distress  HEENT: atraumatic, conjunttiva clear, no obvious abnormalities on inspection of external nose and ears  NECK: normal movements of the head and neck  LUNGS: on inspection no signs of respiratory distress, breathing rate appears normal, no obvious gross SOB, gasping or wheezing  CV: no obvious cyanosis  MS: moves all visible extremities without noticeable abnormality  PSYCH/NEURO: pleasant and cooperative, no obvious depression or anxiety, speech and thought processing grossly intact  LABS: none today    Chemistry      Component Value Date/Time   NA 136 10/01/2018 1208   K 4.2 10/01/2018 1208   CL 105 10/01/2018 1208   CO2 25 10/01/2018 1208   BUN 19 10/01/2018 1208   CREATININE 0.89 10/01/2018 1208      Component Value Date/Time   CALCIUM 9.1 10/01/2018 1208   ALKPHOS 79 10/01/2018 1208   AST 21 10/01/2018 1208   ALT 21 10/01/2018 1208   BILITOT 0.7 10/01/2018 1208      ASSESSMENT AND PLAN:  Discussed the following assessment and plan:  1) HTN: The current medical regimen is effective;  continue present plan and medications.  Will do  lytes/cr along with other HP labs at next appt for CPE in 4-32mo.  2) Migraines: these are in a quiet period still. No new mgmt recommendations. He'll continue to look for any triggers that he can avoid.  I discussed the assessment and treatment plan with the patient. The patient was provided an opportunity to ask questions and all were answered. The patient agreed with the plan and demonstrated an understanding of the instructions.   The patient was advised to call back or seek an in-person evaluation  if the symptoms worsen or if the condition fails to improve as anticipated.  F/u: 4-6 mo CPE  Signed:  Crissie Sickles, MD           09/05/2019

## 2019-09-10 ENCOUNTER — Other Ambulatory Visit: Payer: Self-pay | Admitting: Family Medicine

## 2019-09-10 MED ORDER — IRBESARTAN 150 MG PO TABS: 150.0000 mg | ORAL_TABLET | Freq: Every day | ORAL | 0 refills | Status: DC

## 2019-10-07 DIAGNOSIS — M71572 Other bursitis, not elsewhere classified, left ankle and foot: Secondary | ICD-10-CM | POA: Diagnosis not present

## 2019-10-07 DIAGNOSIS — M7662 Achilles tendinitis, left leg: Secondary | ICD-10-CM | POA: Diagnosis not present

## 2019-10-24 DIAGNOSIS — M722 Plantar fascial fibromatosis: Secondary | ICD-10-CM

## 2019-10-24 DIAGNOSIS — M7662 Achilles tendinitis, left leg: Secondary | ICD-10-CM

## 2019-10-24 HISTORY — DX: Achilles tendinitis, left leg: M76.62

## 2019-10-24 HISTORY — DX: Plantar fascial fibromatosis: M72.2

## 2019-11-03 DIAGNOSIS — M71572 Other bursitis, not elsewhere classified, left ankle and foot: Secondary | ICD-10-CM | POA: Diagnosis not present

## 2019-11-03 DIAGNOSIS — M722 Plantar fascial fibromatosis: Secondary | ICD-10-CM | POA: Diagnosis not present

## 2019-11-03 DIAGNOSIS — M7662 Achilles tendinitis, left leg: Secondary | ICD-10-CM | POA: Diagnosis not present

## 2019-11-17 DIAGNOSIS — M7662 Achilles tendinitis, left leg: Secondary | ICD-10-CM | POA: Diagnosis not present

## 2019-11-21 ENCOUNTER — Other Ambulatory Visit: Payer: Self-pay | Admitting: Family Medicine

## 2019-12-24 ENCOUNTER — Other Ambulatory Visit: Payer: Self-pay

## 2019-12-24 ENCOUNTER — Encounter: Payer: Self-pay | Admitting: Family Medicine

## 2019-12-24 ENCOUNTER — Ambulatory Visit (INDEPENDENT_AMBULATORY_CARE_PROVIDER_SITE_OTHER): Payer: BC Managed Care – PPO | Admitting: Family Medicine

## 2019-12-24 ENCOUNTER — Encounter: Payer: Self-pay | Admitting: Gastroenterology

## 2019-12-24 VITALS — BP 135/86 | HR 77 | Temp 97.7°F | Resp 16 | Ht 68.0 in | Wt 230.8 lb

## 2019-12-24 DIAGNOSIS — M79672 Pain in left foot: Secondary | ICD-10-CM | POA: Diagnosis not present

## 2019-12-24 DIAGNOSIS — I10 Essential (primary) hypertension: Secondary | ICD-10-CM | POA: Diagnosis not present

## 2019-12-24 DIAGNOSIS — M25674 Stiffness of right foot, not elsewhere classified: Secondary | ICD-10-CM | POA: Diagnosis not present

## 2019-12-24 DIAGNOSIS — M79671 Pain in right foot: Secondary | ICD-10-CM

## 2019-12-24 DIAGNOSIS — Z0000 Encounter for general adult medical examination without abnormal findings: Secondary | ICD-10-CM | POA: Diagnosis not present

## 2019-12-24 DIAGNOSIS — G8929 Other chronic pain: Secondary | ICD-10-CM

## 2019-12-24 DIAGNOSIS — Z1211 Encounter for screening for malignant neoplasm of colon: Secondary | ICD-10-CM

## 2019-12-24 DIAGNOSIS — M25675 Stiffness of left foot, not elsewhere classified: Secondary | ICD-10-CM | POA: Diagnosis not present

## 2019-12-24 LAB — LIPID PANEL
Cholesterol: 184 mg/dL (ref 0–200)
HDL: 32.3 mg/dL — ABNORMAL LOW (ref >39.00)
LDL Cholesterol: 112 mg/dL — ABNORMAL HIGH (ref 0–99)
NonHDL: 151.39
Total CHOL/HDL Ratio: 6
Triglycerides: 195 mg/dL — ABNORMAL HIGH (ref 0.0–149.0)
VLDL: 39 mg/dL (ref 0.0–40.0)

## 2019-12-24 LAB — CBC WITH DIFFERENTIAL/PLATELET
Basophils Absolute: 0 10*3/uL (ref 0.0–0.1)
Basophils Relative: 0.5 % (ref 0.0–3.0)
Eosinophils Absolute: 0.2 10*3/uL (ref 0.0–0.7)
Eosinophils Relative: 3 % (ref 0.0–5.0)
HCT: 43.6 % (ref 39.0–52.0)
Hemoglobin: 15 g/dL (ref 13.0–17.0)
Lymphocytes Relative: 25.8 % (ref 12.0–46.0)
Lymphs Abs: 1.4 10*3/uL (ref 0.7–4.0)
MCHC: 34.5 g/dL (ref 30.0–36.0)
MCV: 88.9 fl (ref 78.0–100.0)
Monocytes Absolute: 0.4 10*3/uL (ref 0.1–1.0)
Monocytes Relative: 8.1 % (ref 3.0–12.0)
Neutro Abs: 3.4 10*3/uL (ref 1.4–7.7)
Neutrophils Relative %: 62.6 % (ref 43.0–77.0)
Platelets: 189 10*3/uL (ref 150.0–400.0)
RBC: 4.9 Mil/uL (ref 4.22–5.81)
RDW: 14 % (ref 11.5–15.5)
WBC: 5.4 10*3/uL (ref 4.0–10.5)

## 2019-12-24 LAB — TSH: TSH: 2.14 u[IU]/mL (ref 0.35–4.50)

## 2019-12-24 LAB — COMPREHENSIVE METABOLIC PANEL
ALT: 22 U/L (ref 0–53)
AST: 18 U/L (ref 0–37)
Albumin: 4.3 g/dL (ref 3.5–5.2)
Alkaline Phosphatase: 104 U/L (ref 39–117)
BUN: 16 mg/dL (ref 6–23)
CO2: 30 mEq/L (ref 19–32)
Calcium: 9.6 mg/dL (ref 8.4–10.5)
Chloride: 102 mEq/L (ref 96–112)
Creatinine, Ser: 1.13 mg/dL (ref 0.40–1.50)
GFR: 69.86 mL/min (ref >60.00)
Glucose, Bld: 83 mg/dL (ref 70–99)
Potassium: 5.1 mEq/L (ref 3.5–5.1)
Sodium: 138 mEq/L (ref 135–145)
Total Bilirubin: 0.4 mg/dL (ref 0.2–1.2)
Total Protein: 7.2 g/dL (ref 6.0–8.3)

## 2019-12-24 LAB — C-REACTIVE PROTEIN: CRP: <1 mg/dL (ref 0.5–20.0)

## 2019-12-24 LAB — SEDIMENTATION RATE: Sed Rate: 21 mm/hr — ABNORMAL HIGH (ref 0–15)

## 2019-12-24 IMAGING — CR DG CHEST 2V
2 series · 2 of 2 positions shown · non-contrast
Comparison: None.

CLINICAL DATA: Chest pain.

EXAM:
CHEST - 2 VIEW

[w chest pa]
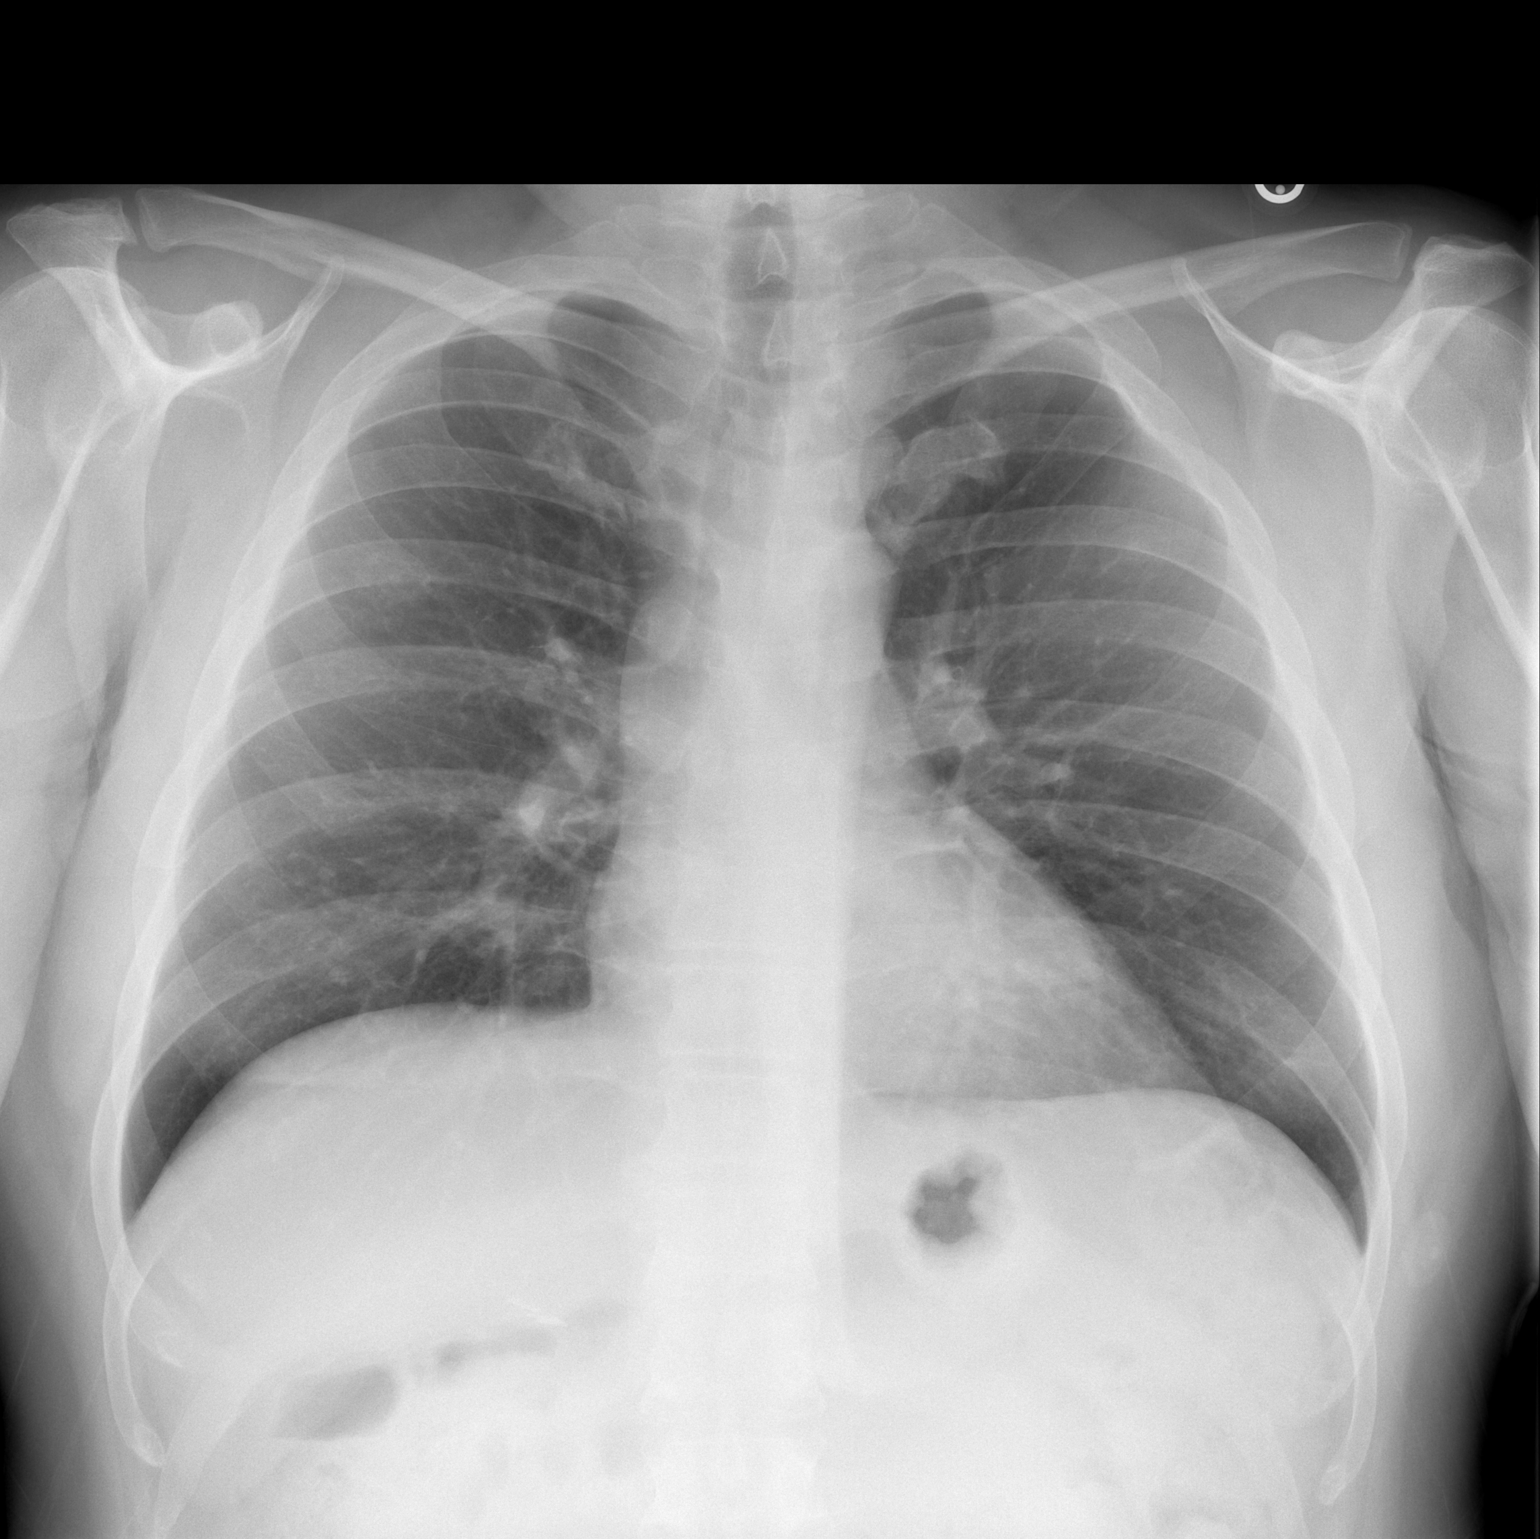

[w chest lat]
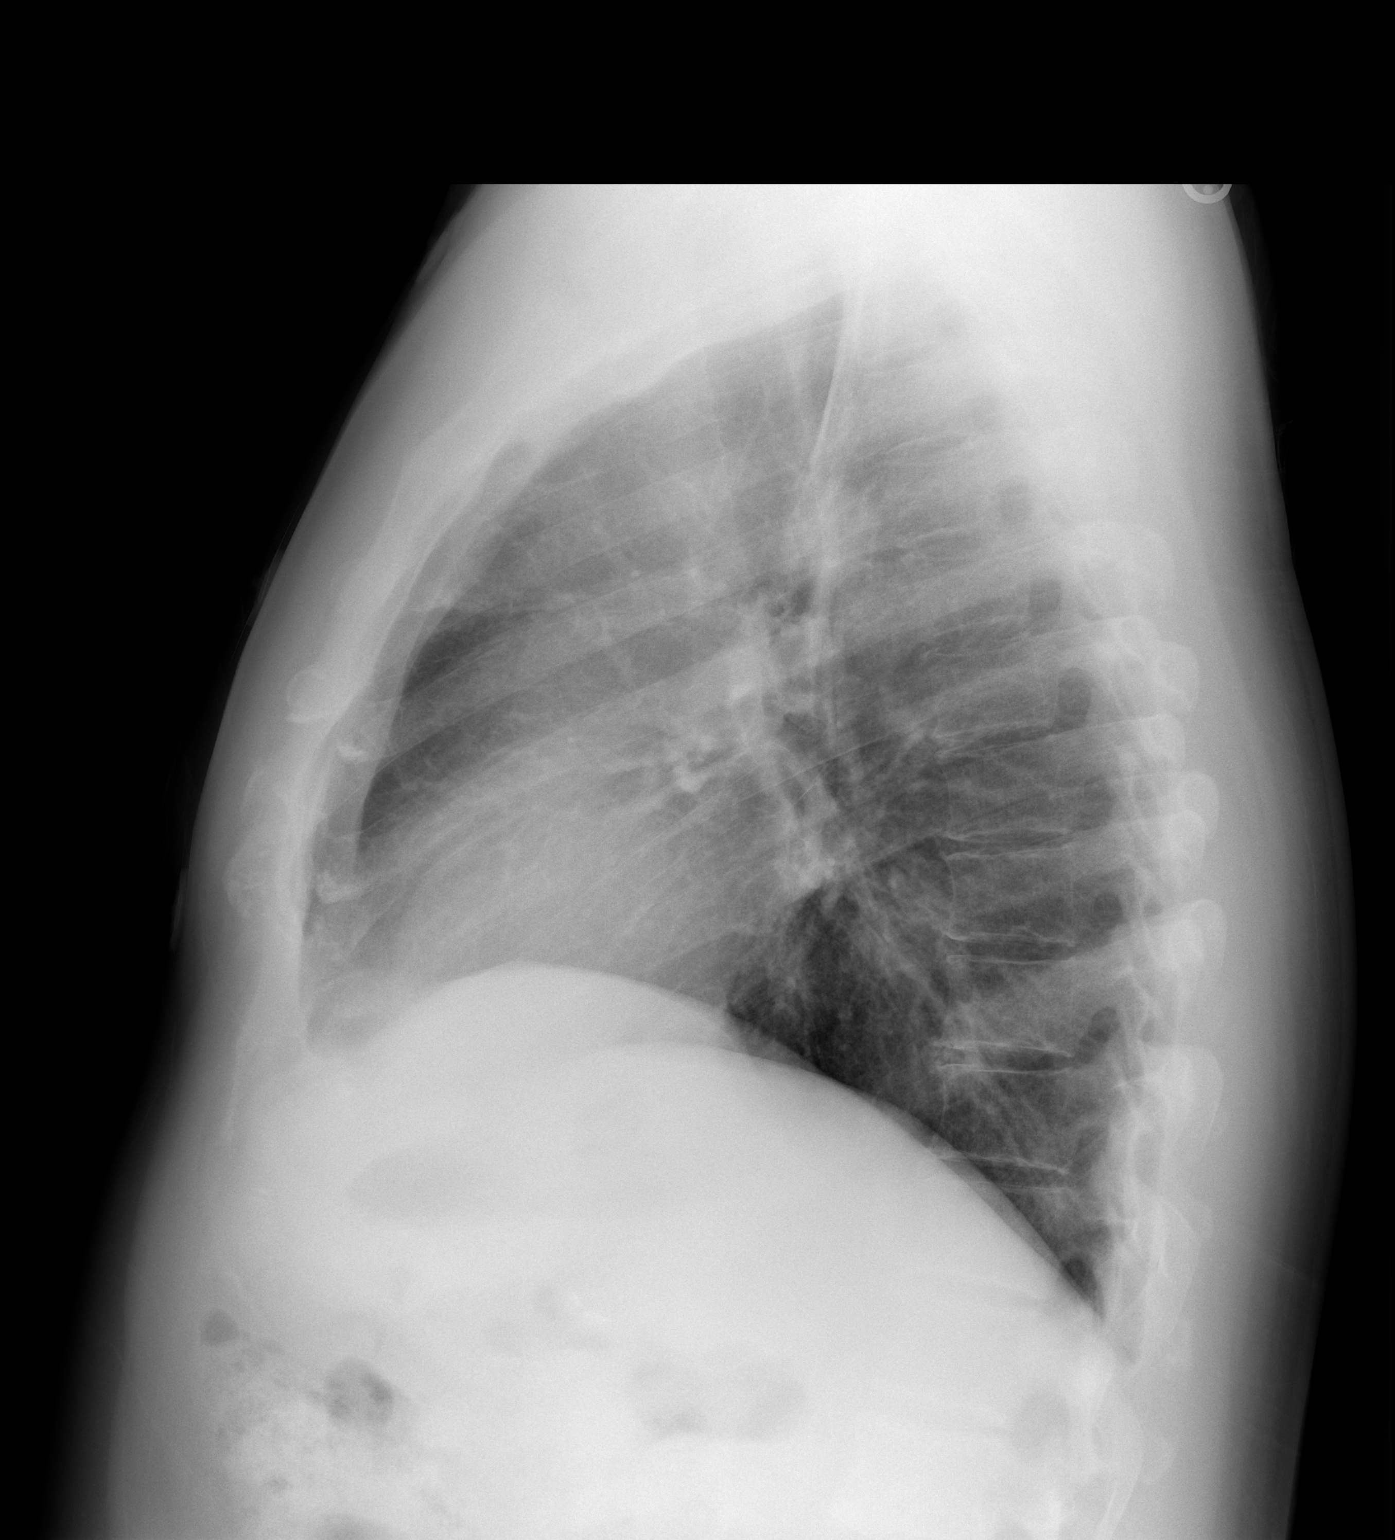

[2 of 2 positions shown; findings below may reference images not displayed]

FINDINGS: The heart size and mediastinal contours are within normal limits.
Both lungs are clear. The visualized skeletal structures are
unremarkable.
IMPRESSION: Normal exam.

## 2019-12-24 MED ORDER — IRBESARTAN 150 MG PO TABS: 150.0000 mg | ORAL_TABLET | Freq: Every day | ORAL | 3 refills | Status: DC

## 2019-12-24 NOTE — Patient Instructions (Signed)

## 2019-12-24 NOTE — Progress Notes (Signed)
Office Note 12/24/2019  CC:  Chief Complaint  Patient presents with  . Annual Exam    pt is fasting    HPI:  Steve Salinas is a 44 y.o. White male who is here for annual health maintenance exam.  Has not taken bp med today. Home bp's avg 120/80. HR around 70.  About 18 mo ago he began having progessive ankes and toes/feet stiffness and pain. Saw podiatrist; got steroid injection in ankles for osteoarthritis.  Morning stiffness in ankles and toes. Decreased ROM of Toes.  Bother feet.  No swelling or redness in these areas. No other joints bother him.  Podiatrist x-rayed them and pt was told him that he may eventually need fusion surgery. Mother and maternal aunt have interstitial lung dz-nonspecific, responding rheum biologics.  ROS: no fevers, no CP, no SOB, no wheezing, no cough, no dizziness, no HAs, no rashes, no melena/hematochezia.  No polyuria or polydipsia.  No myalgias.  NO neck or back or shoulder pains.   Past Medical History:  Diagnosis Date  . Allergic rhinitis   . Allergy    seasonal, constant  . Gastritis and gastroduodenitis 10/10/2018   Bx-->reactive gastropathic changes in a background of chronic gastritis.  H pylori NEG.  . Hives 2019   Viral exanthem/postviral urticaria suspected.  . Hypertension    Normal EKG in ED 06/09/2009  . Migraine with aura    was on low dose topamax in the past (Dr. Ron Parker).  Most recent neuro eval 05/08/13 by Dr. Hassell Done at [facility] in [place]--started [place] ER 519m qd, diclofenac 271mdelayed release tabs--1 q6h prn HA, ordered MRI brain (NORMAL 05/08/13) and sleep evaluation.  . Nephrolithiasis   . Obesity, Class I, BMI 30-34.9   . OSA (obstructive sleep apnea)    uses cpap  . Palpitations 10/2014   Dr. NaAcie Fredrickson/2016; PVCs and PACs on Holter.      Past Surgical History:  Procedure Laterality Date  . ACHILLES TENDON LENGTHENING  [age]rs old  . CHOLECYSTECTOMY N/A 05/13/2015   Procedure: LAPAROSCOPIC  CHOLECYSTECTOMY;  Surgeon: DoCoralie KeensMD;  [place] [facility] Service: General;  Laterality: N/A;  . HERNIA REPAIR Right 1994   inguinal  . kidney stone removed  96   Dr. GrRisa Grills urologist locally.  . Marland KitchenITHOTRIPSY  1997 and 2005  . TYMPANOSTOMY TUBE PLACEMENT  1981  . UPPER GI ENDOSCOPY  10/10/2018  . VASECTOMY      Family History  Problem Relation Age of Onset  . Hypertension Mother   . Hyperlipidemia Mother   . Arthritis Mother   . Heart attack Mother   . Lung disease Mother   . Arthritis Father   . Hyperlipidemia Father   . Hypertension Father   . Heart disease Father        stents  . Hypertension Maternal Grandmother   . Heart attack Maternal Grandfather   . Lung disease Maternal Aunt     Social History   Socioeconomic History  . Marital status: Married    Spouse name: Not on file  . Number of children: Not on file  . Years of education: Not on file  . Highest education level: Not on file  Occupational History  . Not on file  Tobacco Use  . Smoking status: Former Smoker    Packs/day: 1.00    Years: 11.00    Pack years: 11.00    Types: Cigarettes    Quit date: 10/23/1998    Years  since quitting: 21.1  . Smokeless tobacco: Never Used  Substance and Sexual Activity  . Alcohol use: No  . Drug use: No  . Sexual activity: Not on file  Other Topics Concern  . Not on file  Social History Narrative   Married, 4 young children (3 natural, 1 adopted from [place]).   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   Originally from [place].  No tobacco, alc, or drugs.   No formal exercise but works in yard daily.   No OTC decongestants.  16 oz coffee/day.   Social Determinants of Health   Financial Resource Strain:   . Difficulty of Paying Living Expenses: Not on file  Food Insecurity:   . Worried About Charity fundraiser in the Last Year: Not on file  . Ran Out of Food in the Last Year: Not on file  Transportation Needs:   . Lack of  Transportation (Medical): Not on file  . Lack of Transportation (Non-Medical): Not on file  Physical Activity:   . Days of Exercise per Week: Not on file  . Minutes of Exercise per Session: Not on file  Stress:   . Feeling of Stress : Not on file  Social Connections:   . Frequency of Communication with Friends and Family: Not on file  . Frequency of Social Gatherings with Friends and Family: Not on file  . Attends Religious Services: Not on file  . Active Member of Clubs or Organizations: Not on file  . Attends Archivist Meetings: Not on file  . Marital Status: Not on file  Intimate Partner Violence:   . Fear of Current or Ex-Partner: Not on file  . Emotionally Abused: Not on file  . Physically Abused: Not on file  . Sexually Abused: Not on file    Outpatient Medications Prior to Visit  Medication Sig Dispense Refill  . cetirizine (ZYRTEC) 10 MG tablet Take 10 mg by mouth daily.      . DUEXIS 800-26.6 MG TABS Take 1 tablet by mouth 3 (three) times daily.    . fluticasone (FLONASE) 50 MCG/ACT nasal spray SPRAY ONE SPRAY BY BOTH NOSTRILS ROUTE DAILY.    Marland Kitchen irbesartan (AVAPRO) 150 MG tablet TAKE 1 TABLET DAILY 90 tablet 0   No facility-administered medications prior to visit.    No Known Allergies  ROS Review of Systems  Constitutional: Negative for appetite change, chills, fatigue and fever.  HENT: Negative for congestion, dental problem, ear pain and sore throat.   Eyes: Negative for discharge, redness and visual disturbance.  Respiratory: Negative for cough, chest tightness, shortness of breath and wheezing.   Cardiovascular: Negative for chest pain, palpitations and leg swelling.  Gastrointestinal: Negative for abdominal pain, blood in stool, diarrhea, nausea and vomiting.  Genitourinary: Negative for difficulty urinating, dysuria, flank pain, frequency, hematuria and urgency.  Musculoskeletal: Negative for arthralgias (feet/ankles bilat), back pain, joint  swelling, myalgias and neck stiffness.  Skin: Negative for pallor and rash.  Neurological: Negative for dizziness, speech difficulty, weakness and headaches.  Hematological: Negative for adenopathy. Does not bruise/bleed easily.  Psychiatric/Behavioral: Negative for confusion and sleep disturbance. The patient is not nervous/anxious.     PE; Blood pressure 135/86, pulse 77, temperature 97.7 F (36.5 C), temperature source Temporal, resp. rate 16, height 5' 8"  (1.727 m), weight 230 lb 12.8 oz (104.7 kg), SpO2 97 %. Body mass index is 35.09 kg/m.  Gen: Alert, well appearing.  Patient is oriented to person, place, time, and situation. AFFECT:  pleasant, lucid thought and speech. ENT: Ears: EACs clear, normal epithelium.  TMs with good light reflex and landmarks bilaterally.  Eyes: no injection, icteris, swelling, or exudate.  EOMI, PERRLA. Nose: no drainage or turbinate edema/swelling.  No injection or focal lesion.  Mouth: lips without lesion/swelling.  Oral mucosa pink and moist.  Dentition intact and without obvious caries or gingival swelling.  Oropharynx without erythema, exudate, or swelling.  Neck: supple/nontender.  No LAD, mass, or TM.  Carotid pulses 2+ bilaterally, without bruits. CV: RRR, no m/r/g.   LUNGS: CTA bilat, nonlabored resps, good aeration in all lung fields. ABD: soft, NT, ND, BS normal.  No hepatospenomegaly or mass.  No bruits. EXT: no clubbing, cyanosis, or edema.  Musculoskeletal: no joint swelling, erythema, warmth, or tenderness.  ROM of all joints intact. Skin - no sores or suspicious lesions or rashes or color changes   Pertinent labs:  Lab Results  Component Value Date   TSH 1.84 05/10/2018   Lab Results  Component Value Date   WBC 5.3 10/01/2018   HGB 14.6 10/01/2018   HCT 43.1 10/01/2018   MCV 87.8 10/01/2018   PLT 222 10/01/2018   Lab Results  Component Value Date   CREATININE 0.89 10/01/2018   BUN 19 10/01/2018   NA 136 10/01/2018   K 4.2  10/01/2018   CL 105 10/01/2018   CO2 25 10/01/2018   Lab Results  Component Value Date   ALT 21 10/01/2018   AST 21 10/01/2018   ALKPHOS 79 10/01/2018   BILITOT 0.7 10/01/2018   Lab Results  Component Value Date   CHOL 167 05/08/2019   Lab Results  Component Value Date   HDL 31.20 (L) 05/08/2019   Lab Results  Component Value Date   LDLCALC 103 (H) 05/08/2019   Lab Results  Component Value Date   TRIG 164.0 (H) 05/08/2019   Lab Results  Component Value Date   CHOLHDL 5 05/08/2019    ASSESSMENT AND PLAN:   1) Bilat ankles and feet/toes pain and stiffness.  No sign of inflammatory arthritis. However, with his report of mother and maternal aunt with interstitial lung dz being treated with biologic agents I brings up the possibility of some kind of inflammatory arthritis condition for him.  Will get podiatry records, obtain ESR, CRP, CCP, and Rh factor today.  No rheum ref at this time.  2) Health maintenance exam: Reviewed age and gender appropriate health maintenance issues (prudent diet, regular exercise, health risks of tobacco and excessive alcohol, use of seatbelts, fire alarms in home, use of sunscreen).  Also reviewed age and gender appropriate health screening as well as vaccine recommendations. Vaccines: All UTD. Labs: fasting HP labs ordered. Prostate ca screening: average risk patient= as per latest guidelines, start screening at 73 yrs of age. Colon ca screening: due for initial screening->refer to GI today.  An After Visit Summary was printed and given to the patient.  FOLLOW UP:  Return in about 1 year (around 12/23/2020) for annual CPE (fasting).  Signed:  Crissie Sickles, MD           12/24/2019

## 2019-12-26 LAB — CYCLIC CITRUL PEPTIDE ANTIBODY, IGG/IGA: Cyclic Citrullin Peptide Ab: 8 units (ref 0–19)

## 2019-12-26 LAB — RHEUMATOID FACTOR: Rheumatoid fact SerPl-aCnc: <10 IU/mL (ref 0.0–13.9)

## 2020-01-02 ENCOUNTER — Ambulatory Visit (INDEPENDENT_AMBULATORY_CARE_PROVIDER_SITE_OTHER): Payer: BC Managed Care – PPO | Admitting: Podiatry

## 2020-01-02 ENCOUNTER — Telehealth: Payer: Self-pay | Admitting: Podiatry

## 2020-01-02 ENCOUNTER — Encounter: Payer: Self-pay | Admitting: Podiatry

## 2020-01-02 ENCOUNTER — Ambulatory Visit (INDEPENDENT_AMBULATORY_CARE_PROVIDER_SITE_OTHER): Payer: BC Managed Care – PPO

## 2020-01-02 ENCOUNTER — Other Ambulatory Visit: Payer: Self-pay

## 2020-01-02 VITALS — BP 148/91 | HR 79 | Temp 97.2°F | Resp 16

## 2020-01-02 DIAGNOSIS — M2022 Hallux rigidus, left foot: Secondary | ICD-10-CM

## 2020-01-02 DIAGNOSIS — M2021 Hallux rigidus, right foot: Secondary | ICD-10-CM

## 2020-01-02 DIAGNOSIS — M779 Enthesopathy, unspecified: Secondary | ICD-10-CM

## 2020-01-02 DIAGNOSIS — M778 Other enthesopathies, not elsewhere classified: Secondary | ICD-10-CM

## 2020-01-02 DIAGNOSIS — M19071 Primary osteoarthritis, right ankle and foot: Secondary | ICD-10-CM | POA: Diagnosis not present

## 2020-01-02 DIAGNOSIS — M79671 Pain in right foot: Secondary | ICD-10-CM | POA: Diagnosis not present

## 2020-01-02 DIAGNOSIS — M19072 Primary osteoarthritis, left ankle and foot: Secondary | ICD-10-CM | POA: Diagnosis not present

## 2020-01-02 DIAGNOSIS — M79672 Pain in left foot: Secondary | ICD-10-CM

## 2020-01-02 NOTE — Telephone Encounter (Signed)
I am going to have him see rick to see if he can add a mortons extension to the insert and if not we will get him a graphite inserts. If Liliane Channel is not available then he can see Betha.

## 2020-01-02 NOTE — Telephone Encounter (Signed)
Pt called needing something to stabilize his big toe. Dr. Jacqualyn Posey suggested [place]. However, Liliane Channel doesn't do those. Can you call the pt and help him with that?

## 2020-01-02 NOTE — Telephone Encounter (Signed)
I spoke with pt and informed of Dr. Leigh Aurora recommendation and told pt I would transfer to scheduler to set up an appt with our other pedorthist Betha.

## 2020-01-06 ENCOUNTER — Other Ambulatory Visit: Payer: BC Managed Care – PPO | Admitting: Orthotics

## 2020-01-06 ENCOUNTER — Other Ambulatory Visit: Payer: Self-pay

## 2020-01-06 DIAGNOSIS — M79676 Pain in unspecified toe(s): Secondary | ICD-10-CM

## 2020-01-06 NOTE — Progress Notes (Signed)
Subjective:   Patient ID: Steve Salinas, male   DOB: 44 y.o.   MRN: [id number]   HPI 44 year old male presents the office today for concerns of bilateral foot pain of the right side worse than left.  He states that along the bunion he has noticed stiffness and difficulty in his toes which is been getting worse over the last 5 to 6 years.  His sister has a similar issue.  He previously had orthotics made by another doctor which is been somewhat helpful but does get pain on a regular basis to the joints.  He has also previously been tested for rheumatoid arthritis and other systemic arthritides which are apparently negative.   Review of Systems  All other systems reviewed and are negative.   Past Medical History:  Diagnosis Date  . Allergic rhinitis   . Allergy    seasonal, constant  . Gastritis and gastroduodenitis 10/10/2018   Bx-->reactive gastropathic changes in a background of chronic gastritis.  H pylori NEG.  . Hives 2019   Viral exanthem/postviral urticaria suspected.  . Hypertension    Normal EKG in ED 06/09/2009  . Migraine with aura    was on low dose topamax in the past (Dr. Ron Parker).  Most recent neuro eval 05/08/13 by Dr. Hassell Done at [facility] in [place]--started [place] ER 500mg  qd, diclofenac 25mg  delayed release tabs--1 q6h prn HA, ordered MRI brain (NORMAL 05/08/13) and sleep evaluation.  . Nephrolithiasis   . Obesity, Class I, BMI 30-34.9   . OSA (obstructive sleep apnea)    uses cpap  . Palpitations 10/2014   Dr. Acie Fredrickson 11/2014; PVCs and PACs on Holter.      Past Surgical History:  Procedure Laterality Date  . ACHILLES TENDON LENGTHENING  44 yrs old  . CHOLECYSTECTOMY N/A 05/13/2015   Procedure: LAPAROSCOPIC CHOLECYSTECTOMY;  Surgeon: Coralie Keens, MD;  Location: [facility];  Service: General;  Laterality: N/A;  . HERNIA REPAIR Right 1994   inguinal  . kidney stone removed  96   Dr. Risa Grill is urologist locally.  Marland Kitchen LITHOTRIPSY  1997 and 2005  .  TYMPANOSTOMY TUBE PLACEMENT  1981  . UPPER GI ENDOSCOPY  10/10/2018  . VASECTOMY       Current Outpatient Medications:  .  cetirizine (ZYRTEC) 10 MG tablet, Take 10 mg by mouth daily.  , Disp: , Rfl:  .  DUEXIS 800-26.6 MG TABS, Take 1 tablet by mouth 3 (three) times daily., Disp: , Rfl:  .  fluticasone (FLONASE) 50 MCG/ACT nasal spray, SPRAY ONE SPRAY BY BOTH NOSTRILS ROUTE DAILY., Disp: , Rfl:  .  irbesartan (AVAPRO) 150 MG tablet, Take 1 tablet (150 mg total) by mouth daily., Disp: 90 tablet, Rfl: 3  No Known Allergies     Objective:  Physical Exam  General: AAO x3, NAD  Dermatological: Skin is warm, dry and supple bilateral. Nails x 10 are well manicured; remaining integument appears unremarkable at this time. There are no open sores, no preulcerative lesions, no rash or signs of infection present.  Vascular: Dorsalis Pedis artery and Posterior Tibial artery pedal pulses are 2/4 bilateral with immedate capillary fill time. Pedal hair growth present. No varicosities and no lower extremity edema present bilateral. There is no pain with calf compression, swelling, warmth, erythema.   Neruologic: Grossly intact via light touch bilateral.  Protective threshold with Semmes Wienstein monofilament intact to all pedal sites bilateral.   Musculoskeletal: Decreased range of motion of the first IPJ's bilaterally with crepitation present.  Dorsal spurring present of the first MPJs.  Tenderness on the right side worse than left.  Mild edema on the right side.  There is no erythema or warmth.  Muscular strength 5/5 in all groups tested bilateral.  Gait: Unassisted, Nonantalgic.       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  Arthritic changes present to bilateral first MPJs. -Steroid injection for the right first  MPJ. -He can continue Duexis as needed as well. -I will have him follow-up to see if we can add a Morton's extension to his orthotics and if not then possibly add a graphite insert.  Procedure: Injection small joint Discussed alternatives, risks, complications and verbal consent was obtained.  Location: Right first MTPJ. Skin Prep: Betadine. Injectate: 0.5cc 0.5% marcaine plain, 0.5 cc 2% lidocaine plain and, 1 cc kenalog 10. Disposition: Patient tolerated procedure well. Injection site dressed with a band-aid.  Post-injection care was discussed and return precautions discussed.   Trula Slade DPM

## 2020-01-09 ENCOUNTER — Encounter: Payer: Self-pay | Admitting: Family Medicine

## 2020-01-13 ENCOUNTER — Other Ambulatory Visit: Payer: Self-pay

## 2020-01-13 ENCOUNTER — Ambulatory Visit (AMBULATORY_SURGERY_CENTER): Payer: Self-pay | Admitting: *Deleted

## 2020-01-13 VITALS — Temp 96.2°F | Ht 68.0 in | Wt 228.0 lb

## 2020-01-13 DIAGNOSIS — Z01818 Encounter for other preprocedural examination: Secondary | ICD-10-CM

## 2020-01-13 DIAGNOSIS — Z1211 Encounter for screening for malignant neoplasm of colon: Secondary | ICD-10-CM

## 2020-01-13 NOTE — Progress Notes (Signed)
Patient is here in-person for PV. Patient denies any allergies to eggs or soy. Patient denies any problems with anesthesia/sedation. Patient denies any oxygen use at home. Patient denies taking any diet/weight loss medications or blood thinners. Patient is not being treated for MRSA or C-diff. EMMI education assisgned to the patient for the procedure, this was explained and instructions given to patient. COVID-19 screening test is on 4/2, the pt is aware.  Patient is aware of our care-partner policy and 0000000 safety protocol.

## 2020-01-15 ENCOUNTER — Ambulatory Visit: Payer: BC Managed Care – PPO | Attending: Internal Medicine

## 2020-01-15 DIAGNOSIS — Z23 Encounter for immunization: Secondary | ICD-10-CM

## 2020-01-15 NOTE — Progress Notes (Signed)
   Covid-19 Vaccination Clinic  Name:  Steve Salinas    MRN: [id number] DOB: [birth year]  01/15/2020  Steve Salinas was observed post Covid-19 immunization for 15 minutes without incident. He was provided with Vaccine Information Sheet and instruction to access the V-Safe system.   Steve Salinas was instructed to call 911 with any severe reactions post vaccine: Marland Kitchen Difficulty breathing  . Swelling of face and throat  . A fast heartbeat  . A bad rash all over body  . Dizziness and weakness   Immunizations Administered    Name Date Dose VIS Date Route   Pfizer COVID-19 Vaccine 01/15/2020  2:05 PM 0.3 mL 10/03/2019 Intramuscular   Manufacturer: [place]   Lot: [id number]   [place]: [id number]

## 2020-01-19 ENCOUNTER — Ambulatory Visit: Payer: BC Managed Care – PPO

## 2020-01-22 DIAGNOSIS — Z860101 Personal history of adenomatous and serrated colon polyps: Secondary | ICD-10-CM

## 2020-01-22 DIAGNOSIS — Z86010 Personal history of colonic polyps: Secondary | ICD-10-CM

## 2020-01-22 HISTORY — DX: Personal history of colonic polyps: Z86.010

## 2020-01-22 HISTORY — DX: Personal history of adenomatous and serrated colon polyps: Z86.0101

## 2020-01-23 ENCOUNTER — Other Ambulatory Visit: Payer: Self-pay | Admitting: Gastroenterology

## 2020-01-23 ENCOUNTER — Ambulatory Visit (INDEPENDENT_AMBULATORY_CARE_PROVIDER_SITE_OTHER): Payer: BC Managed Care – PPO

## 2020-01-23 DIAGNOSIS — Z1159 Encounter for screening for other viral diseases: Secondary | ICD-10-CM

## 2020-01-23 LAB — SARS CORONAVIRUS 2 (TAT 6-24 HRS): SARS Coronavirus 2: NEGATIVE

## 2020-01-26 ENCOUNTER — Encounter: Payer: Self-pay | Admitting: Gastroenterology

## 2020-01-27 ENCOUNTER — Other Ambulatory Visit: Payer: Self-pay

## 2020-01-27 ENCOUNTER — Ambulatory Visit (AMBULATORY_SURGERY_CENTER): Payer: BC Managed Care – PPO | Admitting: Gastroenterology

## 2020-01-27 ENCOUNTER — Encounter: Payer: Self-pay | Admitting: Gastroenterology

## 2020-01-27 VITALS — BP 106/73 | HR 68 | Temp 97.1°F | Resp 13 | Ht 68.0 in | Wt 228.0 lb

## 2020-01-27 DIAGNOSIS — D124 Benign neoplasm of descending colon: Secondary | ICD-10-CM

## 2020-01-27 DIAGNOSIS — D122 Benign neoplasm of ascending colon: Secondary | ICD-10-CM | POA: Diagnosis not present

## 2020-01-27 DIAGNOSIS — Z1211 Encounter for screening for malignant neoplasm of colon: Secondary | ICD-10-CM

## 2020-01-27 MED ORDER — SODIUM CHLORIDE 0.9 % IV SOLN: 500.0000 mL | Freq: Once | INTRAVENOUS | Status: DC

## 2020-01-27 NOTE — Progress Notes (Signed)
Pt's states no medical or surgical changes since previsit or office visit. 

## 2020-01-27 NOTE — Progress Notes (Signed)
PT taken to PACU. Monitors in place. VSS. Report given to RN. 

## 2020-01-27 NOTE — Patient Instructions (Signed)
YOU HAD AN ENDOSCOPIC PROCEDURE TODAY AT THE [facility]:   Refer to the procedure report that was given to you for any specific questions about what was found during the examination.  If the procedure report does not answer your questions, please call your gastroenterologist to clarify.  If you requested that your care partner not be given the details of your procedure findings, then the procedure report has been included in a sealed envelope for you to review at your convenience later.  YOU SHOULD EXPECT: Some feelings of bloating in the abdomen. Passage of more gas than usual.  Walking can help get rid of the air that was put into your GI tract during the procedure and reduce the bloating. If you had a lower endoscopy (such as a colonoscopy or flexible sigmoidoscopy) you may notice spotting of blood in your stool or on the toilet paper. If you underwent a bowel prep for your procedure, you may not have a normal bowel movement for a few days.  Please Note:  You might notice some irritation and congestion in your nose or some drainage.  This is from the oxygen used during your procedure.  There is no need for concern and it should clear up in a day or so.  SYMPTOMS TO REPORT IMMEDIATELY:   Following lower endoscopy (colonoscopy or flexible sigmoidoscopy):  Excessive amounts of blood in the stool  Significant tenderness or worsening of abdominal pains  Swelling of the abdomen that is new, acute  Fever of 100F or higher  For urgent or emergent issues, a gastroenterologist can be reached at any hour by calling [phone]. Do not use MyChart messaging for urgent concerns.    DIET:  We do recommend a small meal at first, but then you may proceed to your regular diet.  Drink plenty of fluids but you should avoid alcoholic beverages for 24 hours.  ACTIVITY:  You should plan to take it easy for the rest of today and you should NOT DRIVE or use heavy machinery until tomorrow (because  of the sedation medicines used during the test).    FOLLOW UP: Our staff will call the number listed on your records 48-72 hours following your procedure to check on you and address any questions or concerns that you may have regarding the information given to you following your procedure. If we do not reach you, we will leave a message.  We will attempt to reach you two times.  During this call, we will ask if you have developed any symptoms of COVID 19. If you develop any symptoms (ie: fever, flu-like symptoms, shortness of breath, cough etc.) before then, please call [phone].  If you test positive for Covid 19 in the 2 weeks post procedure, please call and report this information to us.    If any biopsies were taken you will be contacted by phone or by letter within the next 1-3 weeks.  Please call us at [phone] if you have not heard about the biopsies in 3 weeks.    SIGNATURES/CONFIDENTIALITY: You and/or your care partner have signed paperwork which will be entered into your electronic medical record.  These signatures attest to the fact that that the information above on your After Visit Summary has been reviewed and is understood.  Full responsibility of the confidentiality of this discharge information lies with you and/or your care-partner. 

## 2020-01-27 NOTE — Op Note (Addendum)
[facility] Patient Name: Steve Salinas Procedure Date: 01/27/2020 11:07 AM MRN: [id number] Endoscopist: Justice Britain , MD Age: 44 Referring MD:  Date of Birth: 01-09-1976 Gender: Male Account #: [id number] Procedure:                Colonoscopy Indications:              Screening for colorectal malignant neoplasm Medicines:                Monitored Anesthesia Care Procedure:                Pre-Anesthesia Assessment:                           - Prior to the procedure, a History and Physical                            was performed, and patient medications and                            allergies were reviewed. The patient's tolerance of                            previous anesthesia was also reviewed. The risks                            and benefits of the procedure and the sedation                            options and risks were discussed with the patient.                            All questions were answered, and informed consent                            was obtained. Prior Anticoagulants: The patient has                            taken no previous anticoagulant or antiplatelet                            agents. ASA Grade Assessment: II - A patient with                            mild systemic disease. After reviewing the risks                            and benefits, the patient was deemed in                            satisfactory condition to undergo the procedure.                           After obtaining informed consent, the colonoscope  was passed under direct vision. Throughout the                            procedure, the patient's blood pressure, pulse, and                            oxygen saturations were monitored continuously. The                            Colonoscope was introduced through the anus and                            advanced to the 5 cm into the ileum. The                            colonoscopy was performed  without difficulty. The                            patient tolerated the procedure. The quality of the                            bowel preparation was good. The terminal ileum,                            ileocecal valve, appendiceal orifice, and rectum                            were photographed. Scope In: 11:16:52 AM Scope Out: 11:32:29 AM Scope Withdrawal Time: 0 hours 11 minutes 39 seconds  Total Procedure Duration: 0 hours 15 minutes 37 seconds  Findings:                 The digital rectal exam findings include                            hemorrhoids. Pertinent negatives include no                            palpable rectal lesions.                           The terminal ileum and ileocecal valve appeared                            normal.                           Two sessile polyps were found in the descending                            colon and ascending colon. The polyps were 2 to 3                            mm in size. These polyps were removed with a cold  snare. Resection and retrieval were complete.                           Normal mucosa was found in the entire colon                            otherwise.                           Non-bleeding non-thrombosed internal hemorrhoids                            were found during retroflexion, during perianal                            exam and during digital exam. The hemorrhoids were                            Grade II (internal hemorrhoids that prolapse but                            reduce spontaneously). Complications:            No immediate complications. Estimated Blood Loss:     Estimated blood loss was minimal. Impression:               - Hemorrhoids found on digital rectal exam.                           - The examined portion of the ileum was normal.                           - Two 2 to 3 mm polyps in the descending colon and                            in the ascending colon, removed with a  cold snare.                            Resected and retrieved.                           - Normal mucosa in the entire examined colon                            otherwise.                           - Non-bleeding non-thrombosed internal hemorrhoids. Recommendation:           - The patient will be observed post-procedure,                            until all discharge criteria are met.                           - Discharge patient to home.                           -  Patient has a contact number available for                            emergencies. The signs and symptoms of potential                            delayed complications were discussed with the                            patient. Return to normal activities tomorrow.                            Written discharge instructions were provided to the                            patient.                           - High fiber diet.                           - Use FiberCon 1 tablet PO daily.                           - Continue present medications.                           - Await pathology results.                           - Repeat colonoscopy in 7/10 years for surveillance                            based on pathology results and findings of                            adenomatous tissue.                           - The findings and recommendations were discussed                            with the patient. Justice Britain, MD 01/27/2020 11:37:33 AM

## 2020-01-27 NOTE — Progress Notes (Signed)
Called to room to assist during endoscopic procedure.  Patient ID and intended procedure confirmed with present staff. Received instructions for my participation in the procedure from the performing physician.  

## 2020-01-27 NOTE — Progress Notes (Signed)
Temperature taken by L.C., VS taken by C.W. 

## 2020-01-29 ENCOUNTER — Encounter: Payer: Self-pay | Admitting: Gastroenterology

## 2020-01-29 ENCOUNTER — Other Ambulatory Visit: Payer: Self-pay

## 2020-01-29 ENCOUNTER — Telehealth: Payer: Self-pay | Admitting: *Deleted

## 2020-01-29 NOTE — Telephone Encounter (Signed)
  Follow up Call-  Call back number 01/27/2020  Post procedure Call Back phone  # [phone]  Permission to leave phone message Yes  Some recent data might be hidden     Patient questions:  Do you have a fever, pain , or abdominal swelling? No. Pain Score  0 *  Have you tolerated food without any problems? Yes.    Have you been able to return to your normal activities? Yes.    Do you have any questions about your discharge instructions: Diet   No. Medications  No. Follow up visit  No.  Do you have questions or concerns about your Care? No.  Actions: * If pain score is 4 or above: No action needed, pain <4  1. Have you developed a fever since your procedure? NO  2.   Have you had an respiratory symptoms (SOB or cough) since your procedure? NO  3.   Have you tested positive for COVID 19 since your procedure NO  4.   Have you had any family members/close contacts diagnosed with the COVID 19 since your procedure?  NO   If yes to any of these questions please route to Joylene John, RN and Erenest Rasher, RN

## 2020-02-02 DIAGNOSIS — D2930 Benign neoplasm of unspecified epididymis: Secondary | ICD-10-CM | POA: Diagnosis not present

## 2020-02-02 HISTORY — PX: COLONOSCOPY: SHX174

## 2020-02-08 ENCOUNTER — Encounter: Payer: Self-pay | Admitting: Family Medicine

## 2020-02-09 ENCOUNTER — Ambulatory Visit: Payer: BC Managed Care – PPO | Attending: Internal Medicine

## 2020-02-09 DIAGNOSIS — Z23 Encounter for immunization: Secondary | ICD-10-CM

## 2020-02-09 NOTE — Progress Notes (Signed)
   Covid-19 Vaccination Clinic  Name:  Steve Salinas    MRN: [id number] DOB: 1976/10/01  02/09/2020  Steve Salinas was observed post Covid-19 immunization for 15 minutes without incident. He was provided with Vaccine Information Sheet and instruction to access the V-Safe system.   Steve Salinas was instructed to call 911 with any severe reactions post vaccine: Marland Kitchen Difficulty breathing  . Swelling of face and throat  . A fast heartbeat  . A bad rash all over body  . Dizziness and weakness   Immunizations Administered    Name Date Dose VIS Date Route   Pfizer COVID-19 Vaccine 02/09/2020 12:31 PM 0.3 mL 12/17/2018 Intramuscular   Manufacturer: [place]   Lot: [id number]   [place]: [id number]

## 2020-02-18 DIAGNOSIS — B070 Plantar wart: Secondary | ICD-10-CM | POA: Diagnosis not present

## 2020-02-24 DIAGNOSIS — D2930 Benign neoplasm of unspecified epididymis: Secondary | ICD-10-CM | POA: Diagnosis not present

## 2020-03-04 ENCOUNTER — Ambulatory Visit: Payer: BC Managed Care – PPO | Admitting: Podiatry

## 2020-03-04 ENCOUNTER — Encounter: Payer: Self-pay | Admitting: Podiatry

## 2020-03-04 ENCOUNTER — Other Ambulatory Visit: Payer: Self-pay

## 2020-03-04 DIAGNOSIS — B078 Other viral warts: Secondary | ICD-10-CM

## 2020-03-04 DIAGNOSIS — B079 Viral wart, unspecified: Secondary | ICD-10-CM

## 2020-03-04 DIAGNOSIS — L989 Disorder of the skin and subcutaneous tissue, unspecified: Secondary | ICD-10-CM | POA: Diagnosis not present

## 2020-03-04 NOTE — Progress Notes (Signed)
Subjective: 44 year old male presents the office today for concerns of a painful skin lesion on the bottom of his right foot.  He states that this has been ongoing since December 2020 when he started shaving with a razor.  He went to his dermatologist.  The area was frozen.  He is not getting any better and continue to get bigger.  The area is tender with pressure in shoes.  He wants discussed removal of the skin lesion.  He has no other concerns today. Denies any systemic complaints such as fevers, chills, nausea, vomiting. No acute changes since last appointment, and no other complaints at this time.   Objective: AAO x3, NAD DP/PT pulses palpable bilaterally, CRT less than 3 seconds On the plantar aspect the right foot just proximal submetatarsal area there is a hyperkeratotic lesion with evidence of verruca.  Measures about 1 cm in diameter.  There is no surrounding erythema there is localized edema.  There is tenderness palpation.  No open lesions or pre-ulcerative lesions.  No pain with calf compression, swelling, warmth, erythema  Assessment: Skin lesion right foot, likely verruca  Plan: -All treatment options discussed with the patient including all alternatives, risks, complications.  -We discussed both conservative as well as surgical treatment options.  This time he wishes to proceed with excision, biopsy of the area.  I initially used 1 cc lidocaine with epinephrine after skin was cleaned with alcohol.  An additional 3 cc of lidocaine, Marcaine plain was infiltrated.  Once anesthetized the skin was prepped with Betadine.  I then utilized #15 with scalpel in order to circumferentially excise the skin lesion I curetted the lesion out.  At this time no further evidence of verruca was identified.  Phenol was applied followed by copious irrigation with alcohol.  Silvadene was applied followed by dry dressing.  He tolerated the procedure well however afterwards he did have bleeding.  I used  Gelfoam to stop the bleeding as well as an additional 1 cc of lidocaine with epinephrine.  He had no further bleeding afterwards. -Post procedure instructions discussed. -Patient encouraged to call the office with any questions, concerns, change in symptoms.   Trula Slade DPM

## 2020-03-04 NOTE — Patient Instructions (Signed)
WARTS (Verrucae)  Warts are caused by a virus that has invaded the skin.  They are more common in young adults and children and a small percentage will resolve on their own.  There are many types of warts including mosaic warts (large flat), vulgaris (domed warts-have pearl like appearance), and plantar warts (flat or cauliflower like appearance).  Warts are highly contagious and may be picked up from any surface.  Warts thrive in a warm moist environment and are common near pools, showers, and locker room floors.  Any microscopic cut in the skin is where the virus enters and becomes a wart.  Warts are very difficult to treat and get rid of.  Patience is necessary in the treatment of this virus.  It may take months to cure and different methods may have to be used to get rid of your wart.  Standard Initial Treatment is: 1. Periodic debridement of the wart and application of Canthacur to each lesion (a blistering agent that will slough off the warty skin) 2. Dispensing of topical treatments/prescriptions to apply to the wart at home  Other options include: 1. Excision of the lesion-numbing the skin around the wart and cutting it out-requires daily soaks post-operatively and takes about 2-3 weeks to fully heal 2. Excision with CO2 Laser-Performed at the surgical center your foot is numbed up and the lesions are all cut out and then lasered with a high power laser.  Very good for multiple warts that are resistant. 3. Cimetidine (Tagamet)-Oral agent used in high does--has shown better results in children  How do I apply the standard topical treatments?  1. Salicylic Acid (Compound W wart remover liquid or gel-available at drug or grocery stores)-Apply a dime size thickness over the wart and cover with duct tape-apply at night so the medication does not spread out to the good skin.  The skin will turn white and slowly blister off.  Use a pumice stone daily to remove the white skin as best you can.  If  the skin gets too raw and painful, discontinue for a few days then resume. 2. Aldara (Imiquimod)-this is an immune response modifier.  They come in little packets so try to get at least 2 days out of each packet if you can.  Apply a small amount to the lesion and cover with duct tape.  Do not rub it in-let it absorb on its own.  Good to apply each morning.  Other Helpful Hints:  Wash shoes that can be washed in the washing machine 2-3 x per month with some bleach  Use Lysol in shoes that cannot be washed and wipe out with a cloth 1 x per week-allow to dry for 8 hours before wearing again  Use a bleach solution (1 part bleach to 3 parts water) in your tub or shower to reduce the spread of the virus to yourself and others Use aqua socks or clean sandals when at the pool or locker room to reduce the chance of picking up the virus or spreading it to othersWart Surgery-Directions for [place] will need: Dial antibacterial hand soap, sterile gauze,  Band-aids  1. Keep the original bandage on until the following morning.  Bathe or shower with the bandage on allowing it to soak, so that when removed it won't stick to the wound. 2. After showering or bathing, remove the old bandage and cleanse the area with Dial soap and water.  Place a few drops of Dial soap and water  on a piece of guaze and gently scrub the area.  Dry with a clean piece of gauze. 3. Apply antibiotic cream (polysporin, triple antibiotic or similar) to the area and place a clean square gauze bandage over and cover with a band-aid. 4. In the evening, add a few drops of Dial soap to a basin of lukewarm water and soak your foot for 15 minutes.  After soaking, follow the instructions above for cleaning the area. 5. Continue cleansing the area as described above two times a day, applying sterile gauze dressings until the doctor informs you that it is not needed. 6. The charge for the surgical procedure includes the follow-up visits after  surgery.  Additional treatments (if necessary) are not included. 7. The time required to heal the surgical site will depend upon the size and location of the wart.  Lesions under bony prominences heal slower.  The average healing time is 2 to 4 weeks. 8. Take over the counter Ibuprofen or Tylenol as needed should you experience any discomfort 9. If you do experience discomfort after surgery, keep the foot elevated and apply an ice pack over your ankle, 30 minutes on, 30 minutes off each hour for the rest of the day. 10. If you have any questions , please do not hesitate to contact the office.

## 2020-03-05 ENCOUNTER — Other Ambulatory Visit: Payer: Self-pay | Admitting: Podiatry

## 2020-03-05 DIAGNOSIS — B078 Other viral warts: Secondary | ICD-10-CM | POA: Diagnosis not present

## 2020-03-05 DIAGNOSIS — B070 Plantar wart: Secondary | ICD-10-CM | POA: Diagnosis not present

## 2020-03-08 LAB — PATHOLOGY REPORT

## 2020-03-08 LAB — TISSUE SPECIMEN

## 2020-03-09 ENCOUNTER — Ambulatory Visit: Payer: BC Managed Care – PPO | Admitting: Podiatry

## 2020-03-10 ENCOUNTER — Telehealth: Payer: Self-pay | Admitting: *Deleted

## 2020-03-10 NOTE — Telephone Encounter (Signed)
-----   Message from Trula Slade, DPM sent at 03/08/2020  5:25 PM EDT ----- + verruca; Lattie Haw can you please call him to see how he is doing following excision? Thanks.

## 2020-03-10 NOTE — Telephone Encounter (Signed)
Called and left a message for the patient stating that I was calling to see how patient was doing after procedure was done with Dr Jacqualyn Posey. Steve Salinas

## 2020-03-11 ENCOUNTER — Ambulatory Visit: Payer: BC Managed Care – PPO | Admitting: Podiatry

## 2020-04-01 ENCOUNTER — Other Ambulatory Visit: Payer: Self-pay

## 2020-04-01 ENCOUNTER — Ambulatory Visit: Payer: BC Managed Care – PPO | Admitting: Podiatry

## 2020-04-01 VITALS — Temp 97.1°F

## 2020-04-01 DIAGNOSIS — L989 Disorder of the skin and subcutaneous tissue, unspecified: Secondary | ICD-10-CM | POA: Diagnosis not present

## 2020-04-01 DIAGNOSIS — L97511 Non-pressure chronic ulcer of other part of right foot limited to breakdown of skin: Secondary | ICD-10-CM

## 2020-04-01 NOTE — Patient Instructions (Signed)

## 2020-04-05 NOTE — Progress Notes (Signed)
Subjective: 44 year old male presents the office today for evaluation after having the right foot verruca excision performed.  He states that he used ibuprofen for the first week and a half and now stopped that.  Still states he does not feel 100% normal but is continue to improve.  Denies any swelling or redness or drainage or pus. Denies any systemic complaints such as fevers, chills, nausea, vomiting. No acute changes since last appointment, and no other complaints at this time.   Objective: AAO x3, NAD DP/PT pulses palpable bilaterally, CRT less than 3 seconds On the right foot submetatarsal 5 area on the area of previous excision is a small superficial wound with hyperkeratotic periwound.  There is no drainage or pus there is no surrounding erythema, ascending cellulitis.  Is no fluctuation crepitation peer there is no malodor.  No signs of infection. No pain with calf compression, swelling, warmth, erythema  Assessment: Healing well status post further excision right foot  Plan: -All treatment options discussed with the patient including all alternatives, risks, complications.  -I recommended washing soap and water daily and dry thoroughly.  Apply a small amount of antibiotic ointment daily.  Continue offloading.  Monitor for signs or symptoms of infection.  Nonhealing next 2 weeks to let me know or sooner if any issues are to arise. -Patient encouraged to call the office with any questions, concerns, change in symptoms.   Steve Salinas DPM

## 2020-04-16 ENCOUNTER — Telehealth: Payer: Self-pay

## 2020-04-16 NOTE — Telephone Encounter (Signed)
Patient brought health care provider form to be filled out for work or insurance reasons. Recent labs done on 12/24/19 for CPE and form completed appropriately. He is aware the form needs his signature for total completion. He will come by and pick this up sometime today. Copy will be made for patient's chart.

## 2020-05-17 DIAGNOSIS — M71572 Other bursitis, not elsewhere classified, left ankle and foot: Secondary | ICD-10-CM | POA: Diagnosis not present

## 2020-05-17 DIAGNOSIS — M25571 Pain in right ankle and joints of right foot: Secondary | ICD-10-CM | POA: Diagnosis not present

## 2020-05-17 DIAGNOSIS — M7662 Achilles tendinitis, left leg: Secondary | ICD-10-CM | POA: Diagnosis not present

## 2020-06-02 ENCOUNTER — Ambulatory Visit: Payer: BC Managed Care – PPO | Admitting: Podiatry

## 2020-06-02 ENCOUNTER — Other Ambulatory Visit: Payer: Self-pay

## 2020-06-02 DIAGNOSIS — M19071 Primary osteoarthritis, right ankle and foot: Secondary | ICD-10-CM | POA: Diagnosis not present

## 2020-06-02 DIAGNOSIS — M205X1 Other deformities of toe(s) (acquired), right foot: Secondary | ICD-10-CM

## 2020-06-02 DIAGNOSIS — M7751 Other enthesopathy of right foot: Secondary | ICD-10-CM | POA: Diagnosis not present

## 2020-06-02 DIAGNOSIS — M25571 Pain in right ankle and joints of right foot: Secondary | ICD-10-CM

## 2020-06-02 NOTE — Progress Notes (Signed)
  Subjective:  Patient ID: Steve Salinas, male    DOB: 1976-06-10,  MRN: [id number]  Chief Complaint  Patient presents with  . Foot Problem    R hallux. Pt stated, "I saw Dr. Jacqualyn Posey in the spring. He took x-rays and said that I lack cartilage in the area - it's grinding. Pain = 6/10 at rest, but it's worse with movement".    44 y.o. male presents with the above complaint. History confirmed with patient. He previously had a single injection in the spring for this and it was helpful. His sister had a similar problem and she had a joint replacement, and has had issues with this and he'd like to avoid this for himself. Most pain is from direct pressure on the bump, some pain and grinding with motion of the joint.   Objective:  Physical Exam: warm, good capillary refill, no trophic changes or ulcerative lesions, normal DP and PT pulses and normal sensory exam.   Right Foot:  Pain over the dorsal spur of the 1st metatarsal head and proximal phalanx, pain on end ROM of the joint with very limited ROM, mild crepitus of the joint  Radiographs: X-ray of right foot dated 01/02/20: dorsal spurring of metatarsal head and proximal phalanx, joint is relatively maintained.  Assessment:   1. Hallux limitus of right foot   2. Bone spur of right foot   3. Osteoarthritis of first metatarsophalangeal (MTP) joint of right foot   4. Pain in joint of right foot      Plan:  Patient was evaluated and treated and all questions answered.  -Discussed the etiology in detail with him and both surgical and non surgical treatment options. Non surgically we discussed shoe gear changes which he has done, orthoses, joint injections which have been helpful and he would like another one today (had several months relief for this previously). Surgically we discussed cheilectomy vs joint replacement vs arthrodesis. I do think with his age if cheilectomy was unsuccessful he'd be best served with an arthrodesis. We also  discussed this in regards to small incision approach vs traditional open approaches. Will discuss further, I think if he is getting >3 months relief with corticosteroid injection we can continue this plan for now.   Procedure: Joint Injection Location: Right 1st MTP joint Skin Prep: Betadine. Injectate: 0.5 cc 1% lidocaine plain, 0.5 cc 0.5% bupivacaine plain, 0.5cc kenalog 10, 0.5 cc dexamethasone phosphate. Disposition: Patient tolerated procedure well. Injection site dressed with a band-aid.   Return in about 3 months (around 09/02/2020).

## 2020-06-03 ENCOUNTER — Encounter: Payer: Self-pay | Admitting: Podiatry

## 2020-07-02 ENCOUNTER — Ambulatory Visit (INDEPENDENT_AMBULATORY_CARE_PROVIDER_SITE_OTHER): Payer: BC Managed Care – PPO

## 2020-07-02 ENCOUNTER — Other Ambulatory Visit: Payer: Self-pay

## 2020-07-02 DIAGNOSIS — Z23 Encounter for immunization: Secondary | ICD-10-CM

## 2020-10-29 ENCOUNTER — Other Ambulatory Visit: Payer: BC Managed Care – PPO | Admitting: Orthotics

## 2020-11-11 ENCOUNTER — Other Ambulatory Visit: Payer: Self-pay

## 2020-11-11 ENCOUNTER — Ambulatory Visit (INDEPENDENT_AMBULATORY_CARE_PROVIDER_SITE_OTHER): Payer: BC Managed Care – PPO | Admitting: Orthotics

## 2020-11-11 DIAGNOSIS — M7752 Other enthesopathy of left foot: Secondary | ICD-10-CM | POA: Diagnosis not present

## 2020-11-11 DIAGNOSIS — M7751 Other enthesopathy of right foot: Secondary | ICD-10-CM | POA: Diagnosis not present

## 2020-11-11 DIAGNOSIS — M19071 Primary osteoarthritis, right ankle and foot: Secondary | ICD-10-CM

## 2020-11-11 DIAGNOSIS — M205X1 Other deformities of toe(s) (acquired), right foot: Secondary | ICD-10-CM

## 2020-11-11 NOTE — Progress Notes (Signed)
Patient really didn't want f/o adjustment; he just wants a new pair.   Cast today for f/o to address foot pain FHL right.

## 2020-11-12 DIAGNOSIS — D225 Melanocytic nevi of trunk: Secondary | ICD-10-CM | POA: Diagnosis not present

## 2020-11-12 DIAGNOSIS — R208 Other disturbances of skin sensation: Secondary | ICD-10-CM | POA: Diagnosis not present

## 2020-11-12 DIAGNOSIS — Z85828 Personal history of other malignant neoplasm of skin: Secondary | ICD-10-CM | POA: Diagnosis not present

## 2020-11-12 DIAGNOSIS — D2262 Melanocytic nevi of left upper limb, including shoulder: Secondary | ICD-10-CM | POA: Diagnosis not present

## 2020-12-09 ENCOUNTER — Ambulatory Visit: Payer: BC Managed Care – PPO | Admitting: Orthotics

## 2020-12-09 ENCOUNTER — Other Ambulatory Visit: Payer: Self-pay

## 2020-12-09 DIAGNOSIS — M7751 Other enthesopathy of right foot: Secondary | ICD-10-CM

## 2020-12-09 DIAGNOSIS — M205X1 Other deformities of toe(s) (acquired), right foot: Secondary | ICD-10-CM

## 2020-12-09 DIAGNOSIS — M19071 Primary osteoarthritis, right ankle and foot: Secondary | ICD-10-CM

## 2020-12-09 NOTE — Progress Notes (Signed)
Patient picked up f/o and was pleased with fit, comfort, and function.  Worked well with footwear.  Told of rbeak in period and how to report any issues.  

## 2021-01-03 ENCOUNTER — Encounter: Payer: Self-pay | Admitting: Podiatry

## 2021-01-12 ENCOUNTER — Telehealth: Payer: Self-pay | Admitting: Podiatry

## 2021-01-12 NOTE — Telephone Encounter (Signed)
Pt left message asking about an old pair of orthotics he has that has the composite plate attached and they do not work for him because they do not fit in any of his shoes except 1 pr. He is asking how he can remove it.  I returned call and left message for pt to call to discuss.

## 2021-01-14 ENCOUNTER — Encounter: Payer: Self-pay | Admitting: Podiatry

## 2021-02-07 ENCOUNTER — Ambulatory Visit: Payer: BC Managed Care – PPO | Admitting: Podiatry

## 2021-02-08 ENCOUNTER — Other Ambulatory Visit: Payer: Self-pay | Admitting: Family Medicine

## 2021-02-15 ENCOUNTER — Encounter: Payer: Self-pay | Admitting: Podiatry

## 2021-02-15 ENCOUNTER — Ambulatory Visit: Payer: BC Managed Care – PPO | Admitting: Podiatry

## 2021-02-15 ENCOUNTER — Other Ambulatory Visit: Payer: Self-pay

## 2021-02-15 DIAGNOSIS — M2021 Hallux rigidus, right foot: Secondary | ICD-10-CM | POA: Diagnosis not present

## 2021-02-15 DIAGNOSIS — M778 Other enthesopathies, not elsewhere classified: Secondary | ICD-10-CM | POA: Diagnosis not present

## 2021-02-17 ENCOUNTER — Other Ambulatory Visit: Payer: Self-pay | Admitting: Podiatry

## 2021-02-17 DIAGNOSIS — M205X1 Other deformities of toe(s) (acquired), right foot: Secondary | ICD-10-CM

## 2021-02-18 ENCOUNTER — Telehealth: Payer: Self-pay | Admitting: Urology

## 2021-02-18 NOTE — Progress Notes (Signed)
Subjective: 45 year old male presents the office today for surgical consultation given right foot arthritis on the big toe joint.  He has tried conservative treatments including shoe modification, inserts, injection therapy without significant resolution.  Semiflexed proceed with surgical intervention. Denies any systemic complaints such as fevers, chills, nausea, vomiting. No acute changes since last appointment, and no other complaints at this time.   Objective: AAO x3, NAD DP/PT pulses palpable bilaterally, CRT less than 3 seconds RIGHT: There is decreased range of motion of the right first MPJ there is crepitation with range of motion.  Dorsal spurring is present.  Trace edema.  No erythema or warmth.  MMT 5/5. No pain with calf compression, swelling, warmth, erythema  Assessment: 45 year old male hallux rigidus right side  Plan: -All treatment options discussed with the patient including all alternatives, risks, complications.  -We discussed both conservative as well surgical treatment options.  After discussion was proceed with surgical intervention. -We will plan for right foot first MPJ arthrodesis -The incision placement as well as the postoperative course was discussed with the patient. I discussed risks of the surgery which include, but not limited to, infection, bleeding, pain, swelling, need for further surgery, delayed or nonhealing, painful or ugly scar, numbness or sensation changes, over/under correction, recurrence, transfer lesions, further deformity, hardware failure, DVT/PE, loss of toe/foot. Patient understands these risks and wishes to proceed with surgery. The surgical consent was reviewed with the patient all 3 pages were signed. No promises or guarantees were given to the outcome of the procedure. All questions were answered to the best of my ability. Before the surgery the patient was encouraged to call the office if there is any further questions. The surgery will be  performed at the [facility] on an outpatient basis. -X-rays ordered -Cam boot dispensed for postoperative use. -Patient encouraged to call the office with any questions, concerns, change in symptoms.   Trula Slade DPM

## 2021-02-18 NOTE — Telephone Encounter (Signed)
DOS - 03/09/21  HALLUX MPJ FUSION RIGHT --- [id number]   BCBS EFFECTIVE DATE - 10/24/19  PLAN DEDUCTIBLE - $400.00 W/ $0.00 REMAINING OUT OF POCKET - $2,500.00 W/ $2,059.00 REMAINING COINSURANCE - 0% COPAY - $0.00   SPOKE WITH NENA WITH [employer]BS AND SHE STATED THAT FOR CPT CODE 16945 NO PRIOR AUTH IS REQUIRED. REF # I - [id number]

## 2021-02-22 ENCOUNTER — Other Ambulatory Visit: Payer: Self-pay

## 2021-02-22 ENCOUNTER — Ambulatory Visit (HOSPITAL_BASED_OUTPATIENT_CLINIC_OR_DEPARTMENT_OTHER)
Admission: RE | Admit: 2021-02-22 | Discharge: 2021-02-22 | Disposition: A | Discharge status: Home / Self Care | Payer: BC Managed Care – PPO | Source: Ambulatory Visit | Attending: Podiatry | Admitting: Podiatry

## 2021-02-22 DIAGNOSIS — M19071 Primary osteoarthritis, right ankle and foot: Secondary | ICD-10-CM | POA: Diagnosis not present

## 2021-02-22 DIAGNOSIS — M205X1 Other deformities of toe(s) (acquired), right foot: Secondary | ICD-10-CM | POA: Insufficient documentation

## 2021-02-23 ENCOUNTER — Telehealth: Payer: Self-pay

## 2021-02-23 NOTE — Telephone Encounter (Signed)
Pt brought in employee paperwork to be filled out. Pt is scheduled to have appt 03/04/21. Paperwork will on my desk for pt to sign on day of appt.

## 2021-02-23 NOTE — Telephone Encounter (Signed)
Sent as FYI, noted °

## 2021-03-02 DIAGNOSIS — R634 Abnormal weight loss: Secondary | ICD-10-CM | POA: Insufficient documentation

## 2021-03-02 DIAGNOSIS — R194 Change in bowel habit: Secondary | ICD-10-CM | POA: Insufficient documentation

## 2021-03-02 DIAGNOSIS — K8020 Calculus of gallbladder without cholecystitis without obstruction: Secondary | ICD-10-CM | POA: Insufficient documentation

## 2021-03-02 DIAGNOSIS — R1011 Right upper quadrant pain: Secondary | ICD-10-CM | POA: Insufficient documentation

## 2021-03-04 ENCOUNTER — Other Ambulatory Visit: Payer: Self-pay

## 2021-03-04 ENCOUNTER — Encounter: Payer: Self-pay | Admitting: Family Medicine

## 2021-03-04 ENCOUNTER — Other Ambulatory Visit: Payer: Self-pay | Admitting: Family Medicine

## 2021-03-04 ENCOUNTER — Ambulatory Visit (INDEPENDENT_AMBULATORY_CARE_PROVIDER_SITE_OTHER): Payer: BC Managed Care – PPO | Admitting: Family Medicine

## 2021-03-04 VITALS — BP 127/83 | HR 71 | Temp 97.9°F | Resp 16 | Ht 69.0 in | Wt 230.5 lb

## 2021-03-04 DIAGNOSIS — I10 Essential (primary) hypertension: Secondary | ICD-10-CM

## 2021-03-04 DIAGNOSIS — Z0000 Encounter for general adult medical examination without abnormal findings: Secondary | ICD-10-CM | POA: Diagnosis not present

## 2021-03-04 DIAGNOSIS — Z1159 Encounter for screening for other viral diseases: Secondary | ICD-10-CM

## 2021-03-04 MED ORDER — IRBESARTAN 150 MG PO TABS: 150.0000 mg | ORAL_TABLET | Freq: Every day | ORAL | 3 refills | Status: DC

## 2021-03-04 NOTE — Patient Instructions (Signed)

## 2021-03-04 NOTE — Telephone Encounter (Signed)
Spoke with pt and he would like to use to mail order for this, verified he has enough for 2 week supply. Spoke with Elmyra Ricks at [facility] to cancel rx on file and rx resent to [facility].

## 2021-03-04 NOTE — Progress Notes (Signed)
Office Note 03/04/2021  CC:  Chief Complaint  Patient presents with  . Annual Exam   HPI:  Steve Salinas is a 45 y.o. White male who is here for health maintenance exam and f/u HTN.  Getting R big toe fusion surgery next week. Mobility decreased and exercise hindered  B/c of this.  4 kids, doing well.   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  Working from home, likes it.  Home bp's consistently 130s/80s.    Past Medical History:  Diagnosis Date  . Allergy   . Chronic allergic rhinitis   . Gastritis and gastroduodenitis 10/10/2018   Bx-->reactive gastropathic changes in a background of chronic gastritis.  H pylori NEG.  . History of adenomatous polyp of colon 01/2020   Recall 5 yrs  . Hives 2019   Viral exanthem/postviral urticaria suspected.  . Hypertension    Normal EKG in ED 06/09/2009  . Migraine with aura    was on low dose topamax in the past (Dr. Ron Parker).  Most recent neuro eval 05/08/13 by Dr. Hassell Done at [facility] in [place]--started [place] ER 500mg  qd, diclofenac 25mg  delayed release tabs--1 q6h prn HA, ordered MRI brain (NORMAL 05/08/13) and sleep evaluation.  . Nephrolithiasis   . Obesity, Class I, BMI 30-34.9   . OSA (obstructive sleep apnea)    uses cpap  . Osteoarthritis of both ankles   . Palpitations 10/2014   Dr. Acie Fredrickson 11/2014; PVCs and PACs on Holter.    . Plantar fasciitis, left 10/2019  . Sleep apnea    does not use the CPAP  . Tendonitis, Achilles, left 10/2019    Past Surgical History:  Procedure Laterality Date  . ACHILLES TENDON LENGTHENING  45 yrs old  . CHOLECYSTECTOMY N/A 05/13/2015   Procedure: LAPAROSCOPIC CHOLECYSTECTOMY;  Surgeon: Coralie Keens, MD;  Location: [place];  Service: General;  Laterality: N/A;  . COLONOSCOPY  02/02/2020   2021 adenomas x 2, recall 5 yrs.  Marland Kitchen HERNIA REPAIR Right 1994   inguinal  . kidney stone removed  96   Dr. Risa Grill is urologist locally.  Marland Kitchen LITHOTRIPSY  1997 and 2005  . TYMPANOSTOMY TUBE  PLACEMENT  1981  . UPPER GI ENDOSCOPY  10/10/2018  . VASECTOMY      Family History  Problem Relation Age of Onset  . Hypertension Mother   . Hyperlipidemia Mother   . Arthritis Mother   . Heart attack Mother   . Lung disease Mother   . Arthritis Father   . Hyperlipidemia Father   . Hypertension Father   . Heart disease Father        stents  . Hypertension Maternal Grandmother   . Heart attack Maternal Grandfather   . Lung disease Maternal Aunt   . Colon cancer Neg Hx   . Colon polyps Neg Hx   . Esophageal cancer Neg Hx   . Rectal cancer Neg Hx   . Stomach cancer Neg Hx     Social History   Socioeconomic History  . Marital status: Married    Spouse name: Not on file  . Number of children: Not on file  . Years of education: Not on file  . Highest education level: Not on file  Occupational History  . Not on file  Tobacco Use  . Smoking status: Former Smoker    Packs/day: 1.00    Years: 11.00    Pack years: 11.00    Types: Cigarettes    Quit date: 10/23/1998    Years  since quitting: 22.3  . Smokeless tobacco: Never Used  Vaping Use  . Vaping Use: Never used  Substance and Sexual Activity  . Alcohol use: No  . Drug use: No  . Sexual activity: Not on file  Other Topics Concern  . Not on file  Social History Narrative   Married, 4 young children (3 natural, 1 adopted from [place]).   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   Originally from [place].  No tobacco, alc, or drugs.   No formal exercise but works in yard daily.   No OTC decongestants.  16 oz coffee/day.   Social Determinants of Health   Financial Resource Strain: Not on file  Food Insecurity: Not on file  Transportation Needs: Not on file  Physical Activity: Not on file  Stress: Not on file  Social Connections: Not on file  Intimate Partner Violence: Not on file    Outpatient Medications Prior to Visit  Medication Sig Dispense Refill  . cetirizine (ZYRTEC) 10 MG tablet Take 10 mg  by mouth daily.    . DUEXIS 800-26.6 MG TABS Take 1 tablet by mouth 3 (three) times daily.    . fluticasone (FLONASE) 50 MCG/ACT nasal spray SPRAY ONE SPRAY BY BOTH NOSTRILS ROUTE DAILY.    Marland Kitchen irbesartan (AVAPRO) 150 MG tablet TAKE 1 TABLET DAILY 30 tablet 0   No facility-administered medications prior to visit.    No Known Allergies  ROS Review of Systems  Constitutional: Negative for appetite change, chills, fatigue and fever.  HENT: Negative for congestion, dental problem, ear pain and sore throat.   Eyes: Negative for discharge, redness and visual disturbance.  Respiratory: Negative for cough, chest tightness, shortness of breath and wheezing.   Cardiovascular: Negative for chest pain, palpitations and leg swelling.  Gastrointestinal: Negative for abdominal pain, blood in stool, diarrhea, nausea and vomiting.  Genitourinary: Negative for difficulty urinating, dysuria, flank pain, frequency, hematuria and urgency.  Musculoskeletal: Positive for arthralgias (chronic right toe pain->podiatrist). Negative for back pain, joint swelling, myalgias and neck stiffness.  Skin: Negative for pallor and rash.  Neurological: Negative for dizziness, speech difficulty, weakness and headaches (occasional migraine).  Hematological: Negative for adenopathy. Does not bruise/bleed easily.  Psychiatric/Behavioral: Negative for confusion and sleep disturbance. The patient is not nervous/anxious.     PE; Vitals with BMI 03/04/2021 01/27/2020 01/27/2020  Height 5\' 9"  - -  Weight 230 lbs 8 oz - -  BMI 22.29 - -  Systolic 798 921 194  Diastolic 83 73 72  Pulse 71 68 63   Gen: Alert, well appearing.  Patient is oriented to person, place, time, and situation. AFFECT: pleasant, lucid thought and speech. ENT: Ears: EACs clear, normal epithelium.  TMs with good light reflex and landmarks bilaterally.  Eyes: no injection, icteris, swelling, or exudate.  EOMI, PERRLA. Nose: no drainage or turbinate edema/swelling.   No injection or focal lesion.  Mouth: lips without lesion/swelling.  Oral mucosa pink and moist.  Dentition intact and without obvious caries or gingival swelling.  Oropharynx without erythema, exudate, or swelling.  Neck: supple/nontender.  No LAD, mass, or TM.  Carotid pulses 2+ bilaterally, without bruits. CV: RRR, no m/r/g.   LUNGS: CTA bilat, nonlabored resps, good aeration in all lung fields. ABD: soft, NT, ND, BS normal.  No hepatospenomegaly or mass.  No bruits. EXT: no clubbing, cyanosis, or edema.  Musculoskeletal: no joint swelling, erythema, warmth, or tenderness.  ROM of all joints intact. Skin - no sores or suspicious lesions  or rashes or color changes   Pertinent labs:  Lab Results  Component Value Date   TSH 2.14 12/24/2019   Lab Results  Component Value Date   WBC 5.4 12/24/2019   HGB 15.0 12/24/2019   HCT 43.6 12/24/2019   MCV 88.9 12/24/2019   PLT 189.0 12/24/2019   Lab Results  Component Value Date   CREATININE 1.13 12/24/2019   BUN 16 12/24/2019   NA 138 12/24/2019   K 5.1 12/24/2019   CL 102 12/24/2019   CO2 30 12/24/2019   Lab Results  Component Value Date   ALT 22 12/24/2019   AST 18 12/24/2019   ALKPHOS 104 12/24/2019   BILITOT 0.4 12/24/2019   Lab Results  Component Value Date   CHOL 184 12/24/2019   Lab Results  Component Value Date   HDL 32.30 (L) 12/24/2019   Lab Results  Component Value Date   LDLCALC 112 (H) 12/24/2019   Lab Results  Component Value Date   TRIG 195.0 (H) 12/24/2019   Lab Results  Component Value Date   CHOLHDL 6 12/24/2019   ASSESSMENT AND PLAN:   1) HTN: stable/well controlled on irbesartan 150mg  qd. Lytes/cr-future with fasting labs.  2) Health maintenance exam: Reviewed age and gender appropriate health maintenance issues (prudent diet, regular exercise, health risks of tobacco and excessive alcohol, use of seatbelts, fire alarms in home, use of sunscreen).  Also reviewed age and gender appropriate  health screening as well as vaccine recommendations. Vaccines:  ALL UTD. Labs: cbc, cmet, flp, tsh, hep c screen ordered-->returning next week for labs when fasting. Prostate ca screening: average risk patient= as per latest guidelines, start screening at 42 yrs of age. Colon ca screening: adenomas 01/2020-->recall 2026.  An After Visit Summary was printed and given to the patient.  FOLLOW UP:  No follow-ups on file.  Signed:  Crissie Sickles, MD           03/04/2021

## 2021-03-07 ENCOUNTER — Ambulatory Visit: Payer: BC Managed Care – PPO

## 2021-03-07 ENCOUNTER — Other Ambulatory Visit: Payer: Self-pay

## 2021-03-07 ENCOUNTER — Ambulatory Visit (INDEPENDENT_AMBULATORY_CARE_PROVIDER_SITE_OTHER): Payer: BC Managed Care – PPO

## 2021-03-07 DIAGNOSIS — Z0000 Encounter for general adult medical examination without abnormal findings: Secondary | ICD-10-CM

## 2021-03-07 DIAGNOSIS — I10 Essential (primary) hypertension: Secondary | ICD-10-CM

## 2021-03-07 DIAGNOSIS — Z1159 Encounter for screening for other viral diseases: Secondary | ICD-10-CM

## 2021-03-08 LAB — COMPREHENSIVE METABOLIC PANEL
AG Ratio: 1.6 (calc) (ref 1.0–2.5)
ALT: 24 U/L (ref 9–46)
AST: 21 U/L (ref 10–40)
Albumin: 4.3 g/dL (ref 3.6–5.1)
Alkaline phosphatase (APISO): 82 U/L (ref 36–130)
BUN: 20 mg/dL (ref 7–25)
CO2: 23 mmol/L (ref 20–32)
Calcium: 9.4 mg/dL (ref 8.6–10.3)
Chloride: 100 mmol/L (ref 98–110)
Creat: 1.12 mg/dL (ref 0.60–1.35)
Globulin: 2.7 g/dL (calc) (ref 1.9–3.7)
Glucose, Bld: 89 mg/dL (ref 65–99)
Potassium: 4.6 mmol/L (ref 3.5–5.3)
Sodium: 134 mmol/L — ABNORMAL LOW (ref 135–146)
Total Bilirubin: 0.6 mg/dL (ref 0.2–1.2)
Total Protein: 7 g/dL (ref 6.1–8.1)

## 2021-03-08 LAB — LIPID PANEL
Cholesterol: 178 mg/dL (ref <200)
HDL: 29 mg/dL — ABNORMAL LOW (ref >=40)
LDL Cholesterol (Calc): 118 mg/dL (calc) — ABNORMAL HIGH
Non-HDL Cholesterol (Calc): 149 mg/dL (calc) — ABNORMAL HIGH (ref <130)
Total CHOL/HDL Ratio: 6.1 (calc) — ABNORMAL HIGH (ref <5.0)
Triglycerides: 193 mg/dL — ABNORMAL HIGH (ref <150)

## 2021-03-08 LAB — CBC WITH DIFFERENTIAL/PLATELET
Absolute Monocytes: 509 cells/uL (ref 200–950)
Basophils Absolute: 32 cells/uL (ref 0–200)
Basophils Relative: 0.6 %
Eosinophils Absolute: 148 cells/uL (ref 15–500)
Eosinophils Relative: 2.8 %
HCT: 43.7 % (ref 38.5–50.0)
Hemoglobin: 14.7 g/dL (ref 13.2–17.1)
Lymphs Abs: 1362 cells/uL (ref 850–3900)
MCH: 29.8 pg (ref 27.0–33.0)
MCHC: 33.6 g/dL (ref 32.0–36.0)
MCV: 88.5 fL (ref 80.0–100.0)
MPV: 11.5 fL (ref 7.5–12.5)
Monocytes Relative: 9.6 %
Neutro Abs: 3249 cells/uL (ref 1500–7800)
Neutrophils Relative %: 61.3 %
Platelets: 203 10*3/uL (ref 140–400)
RBC: 4.94 10*6/uL (ref 4.20–5.80)
RDW: 12.9 % (ref 11.0–15.0)
Total Lymphocyte: 25.7 %
WBC: 5.3 10*3/uL (ref 3.8–10.8)

## 2021-03-08 LAB — TSH: TSH: 2.42 mIU/L (ref 0.40–4.50)

## 2021-03-08 LAB — HEPATITIS C ANTIBODY
Hepatitis C Ab: NONREACTIVE
SIGNAL TO CUT-OFF: 0.01 (ref <1.00)

## 2021-03-09 ENCOUNTER — Encounter: Payer: Self-pay | Admitting: Podiatry

## 2021-03-09 ENCOUNTER — Telehealth: Payer: Self-pay

## 2021-03-09 ENCOUNTER — Other Ambulatory Visit: Payer: Self-pay | Admitting: Podiatry

## 2021-03-09 DIAGNOSIS — M2021 Hallux rigidus, right foot: Secondary | ICD-10-CM | POA: Diagnosis not present

## 2021-03-09 DIAGNOSIS — M25571 Pain in right ankle and joints of right foot: Secondary | ICD-10-CM | POA: Diagnosis not present

## 2021-03-09 MED ORDER — CEPHALEXIN 500 MG PO CAPS: 500.0000 mg | ORAL_CAPSULE | Freq: Three times a day (TID) | ORAL | 0 refills | Status: DC

## 2021-03-09 MED ORDER — OXYCODONE-ACETAMINOPHEN 5-325 MG PO TABS: 1.0000 | ORAL_TABLET | Freq: Four times a day (QID) | ORAL | 0 refills | Status: DC | PRN

## 2021-03-09 MED ORDER — PROMETHAZINE HCL 25 MG PO TABS: 25.0000 mg | ORAL_TABLET | Freq: Three times a day (TID) | ORAL | 0 refills | Status: DC | PRN

## 2021-03-09 NOTE — Telephone Encounter (Signed)
Pt brought health form for completion. Last o/v was 03/04/21, Placed on PCP desk to review and sign, if appropriate.

## 2021-03-09 NOTE — Progress Notes (Signed)
Postop medications sent 

## 2021-03-11 NOTE — Telephone Encounter (Signed)
Called patient and stated that I was calling to see how the patient was doing after having surgery with Dr Jacqualyn Posey on Wednesday 03-09-2021 and patient stated that the nerve block is working and can wiggle the toes and has really had no pain at all and has taken the pain medicine as needed and I stated that if the patient was not having a lot of pain and did not want to take the pain medicine could take the ibuprofen instead and there was not any fever or chills and not any nausea and patient was not icing but elevating and I stated to ice some if possible and that would help with the swelling and to call the office if any concerns or questions. Lattie Haw

## 2021-03-14 ENCOUNTER — Other Ambulatory Visit: Payer: Self-pay

## 2021-03-14 ENCOUNTER — Ambulatory Visit (INDEPENDENT_AMBULATORY_CARE_PROVIDER_SITE_OTHER): Payer: BC Managed Care – PPO

## 2021-03-14 ENCOUNTER — Ambulatory Visit (INDEPENDENT_AMBULATORY_CARE_PROVIDER_SITE_OTHER): Payer: BC Managed Care – PPO | Admitting: Podiatry

## 2021-03-14 DIAGNOSIS — M778 Other enthesopathies, not elsewhere classified: Secondary | ICD-10-CM

## 2021-03-14 DIAGNOSIS — M205X1 Other deformities of toe(s) (acquired), right foot: Secondary | ICD-10-CM

## 2021-03-14 DIAGNOSIS — Z9889 Other specified postprocedural states: Secondary | ICD-10-CM

## 2021-03-14 MED ORDER — OXYCODONE-ACETAMINOPHEN 5-325 MG PO TABS: 1.0000 | ORAL_TABLET | Freq: Four times a day (QID) | ORAL | 0 refills | Status: DC | PRN

## 2021-03-15 NOTE — Progress Notes (Signed)
Subjective: Steve Salinas is a 45 y.o. is seen today in office s/p right first MPJ arthrodesis preformed on 03/09/2021.  States he is doing better.  He takes pain medicine intermittently.  Is been nonweightbearing to cam boot.  He did have one stumble and almost fell when he caught himself.  Denies any systemic complaints such as fevers, chills, nausea, vomiting. No calf pain, chest pain, shortness of breath.   Objective: General: No acute distress, AAOx3  DP/PT pulses palpable 2/4, CRT < 3 sec to all digits.  Protective sensation intact. Motor function intact.  Right foot: Incision is well coapted without any evidence of dehiscence with sutures intact. There is no surrounding erythema, ascending cellulitis, fluctuance, crepitus, malodor, drainage/purulence. There is mild to moderate edema around the surgical site. There is mild pain along the surgical site.  No evidence of any hematoma or signs of infection. No other areas of tenderness to bilateral lower extremities.  No other open lesions or pre-ulcerative lesions.  No pain with calf compression, swelling, warmth, erythema.   Assessment and Plan:  Status post right first MPJ arthrodesis, doing well with no complications   -Treatment options discussed including all alternatives, risks, and complications -X-rays obtained reviewed.  Hardware intact.  No evidence of acute fracture otherwise. -Incision was clean.  Xeroform was applied followed by dry sterile dressing.  Keep the dressing clean, dry, intact. -Remain nonweightbearing cam boot. -Ice/elevation -Pain medication as needed-refilled today -Monitor for any clinical signs or symptoms of infection and DVT/PE and directed to call the office immediately should any occur or go to the ER. -Follow-up as scheduled or sooner if any problems arise. In the meantime, encouraged to call the office with any questions, concerns, change in symptoms.   Celesta Gentile, DPM

## 2021-03-23 ENCOUNTER — Other Ambulatory Visit: Payer: Self-pay | Admitting: Podiatry

## 2021-03-23 HISTORY — PX: TOE FUSION: SHX1070

## 2021-03-23 NOTE — Telephone Encounter (Signed)
Please advise 

## 2021-03-24 ENCOUNTER — Telehealth: Payer: Self-pay | Admitting: Podiatry

## 2021-03-24 ENCOUNTER — Encounter: Payer: BC Managed Care – PPO | Admitting: Podiatry

## 2021-03-24 MED ORDER — OXYCODONE-ACETAMINOPHEN 5-325 MG PO TABS: 1.0000 | ORAL_TABLET | Freq: Four times a day (QID) | ORAL | 0 refills | Status: DC | PRN

## 2021-03-24 NOTE — Telephone Encounter (Signed)
I called the Patient back to let him know that Dr.wagoner has refilled his pain medication.

## 2021-03-24 NOTE — Telephone Encounter (Signed)
I sent it over earlier today.

## 2021-03-24 NOTE — Telephone Encounter (Signed)
Patient called our office stating he has ran out of pain medication and would like a refill.

## 2021-03-28 ENCOUNTER — Ambulatory Visit (INDEPENDENT_AMBULATORY_CARE_PROVIDER_SITE_OTHER): Payer: BC Managed Care – PPO | Admitting: Podiatry

## 2021-03-28 ENCOUNTER — Ambulatory Visit (INDEPENDENT_AMBULATORY_CARE_PROVIDER_SITE_OTHER): Payer: BC Managed Care – PPO

## 2021-03-28 ENCOUNTER — Other Ambulatory Visit: Payer: Self-pay

## 2021-03-28 DIAGNOSIS — M778 Other enthesopathies, not elsewhere classified: Secondary | ICD-10-CM

## 2021-03-28 DIAGNOSIS — M205X1 Other deformities of toe(s) (acquired), right foot: Secondary | ICD-10-CM | POA: Diagnosis not present

## 2021-03-28 DIAGNOSIS — Z9889 Other specified postprocedural states: Secondary | ICD-10-CM

## 2021-04-01 ENCOUNTER — Encounter: Payer: Self-pay | Admitting: Podiatry

## 2021-04-02 NOTE — Progress Notes (Signed)
Subjective: Sender Steve Salinas is a 45 y.o. is seen today in office s/p right first MPJ arthrodesis preformed on 03/09/2021.  Feels that the pain is improving not having any significant pain currently has no new concerns today.  Denies any fevers, chills, nausea, vomiting.  No calf pain, chest pain, shortness of breath.  Objective: General: No acute distress, AAOx3  DP/PT pulses palpable 2/4, CRT < 3 sec to all digits.  Protective sensation intact. Motor function intact.  Right foot: Incision is well coapted without any evidence of dehiscence.  There is minimal edema.  There is no erythema or warmth.  Arthrodesis site appears to be stable.  No significant tenderness palpation on surgical site.  Minimal edema. No other open lesions or pre-ulcerative lesions.  No pain with calf compression, swelling, warmth, erythema.   Assessment and Plan:  Status post right first MPJ arthrodesis, doing well with no complications   -Treatment options discussed including all alternatives, risks, and complications -X-rays obtained reviewed.  Hardware intact.  The distal portion of the staple is holding the dorsal portion of the proximal phalanx.  No evidence of acute fracture otherwise. -Incision was clean.  Xeroform was applied followed by dry sterile dressing.   -He can wash the foot with soap and water and dry thoroughly and apply a small amount of antibiotic ointment and dressing. -At this point he can start to transition to partial weightbearing as tolerated. -Ice/elevation -Pain medication as needed-refilled today -Monitor for any clinical signs or symptoms of infection and DVT/PE and directed to call the office immediately should any occur or go to the ER. -Follow-up as scheduled or sooner if any problems arise. In the meantime, encouraged to call the office with any questions, concerns, change in symptoms.   Repeat foot x-rays next appointment  Celesta Gentile, DPM

## 2021-04-07 ENCOUNTER — Encounter: Payer: BC Managed Care – PPO | Admitting: Podiatry

## 2021-04-14 ENCOUNTER — Ambulatory Visit: Payer: BC Managed Care – PPO

## 2021-04-14 ENCOUNTER — Other Ambulatory Visit: Payer: Self-pay

## 2021-04-14 ENCOUNTER — Ambulatory Visit (INDEPENDENT_AMBULATORY_CARE_PROVIDER_SITE_OTHER): Payer: BC Managed Care – PPO | Admitting: Podiatry

## 2021-04-14 DIAGNOSIS — Z9889 Other specified postprocedural states: Secondary | ICD-10-CM

## 2021-04-14 DIAGNOSIS — M205X1 Other deformities of toe(s) (acquired), right foot: Secondary | ICD-10-CM

## 2021-04-21 NOTE — Progress Notes (Signed)
Subjective: Steve Salinas is a 45 y.o. is seen today in office s/p right first MPJ arthrodesis preformed on 03/09/2021.  He has been walking in the cam boot.  No significant discomfort that he reports.  Overall doing much better.  Not taking any pain medication.  Still gets occasional swelling. Denies any fevers, chills, nausea, vomiting.  No calf pain, chest pain, shortness of breath.  Objective: General: No acute distress, AAOx3  DP/PT pulses palpable 2/4, CRT < 3 sec to all digits.  Protective sensation intact. Motor function intact.  Right foot: Incision is well coapted without any evidence of dehiscence.  Mild edema to the surgical site there is no erythema or warmth.  There is no drainage or pus come from the surgical site or any signs of infection.  Arthrodesis site appears to be stable. No pain with calf compression, swelling, warmth, erythema.   Assessment and Plan:  Status post right first MPJ arthrodesis, doing well with no complications   -Treatment options discussed including all alternatives, risks, and complications -X-rays obtained reviewed.  Hardware intact.  The distal portion of the staple is holding the dorsal portion of the proximal phalanx.  No evidence of acute fracture otherwise.  There is of some increased consolidation across the arthrodesis site. -At this time discussed with him that he can start to gradually transition back into regular shoe as tolerated.  If there is any increasing pain or swelling he is to return to the surgical shoe/boot.  Compression anklet for any postoperative edema.  Continue ice elevation.  Anti-inflammatories as needed.  Follow-up in 2 to 3 weeks for postop visit, repeat x-rays  Trula Slade DPM

## 2021-05-05 ENCOUNTER — Ambulatory Visit (INDEPENDENT_AMBULATORY_CARE_PROVIDER_SITE_OTHER): Payer: BC Managed Care – PPO

## 2021-05-05 ENCOUNTER — Ambulatory Visit (INDEPENDENT_AMBULATORY_CARE_PROVIDER_SITE_OTHER): Payer: BC Managed Care – PPO | Admitting: Podiatry

## 2021-05-05 ENCOUNTER — Other Ambulatory Visit: Payer: Self-pay

## 2021-05-05 DIAGNOSIS — Z9889 Other specified postprocedural states: Secondary | ICD-10-CM

## 2021-05-10 NOTE — Progress Notes (Signed)
Subjective: Steve Salinas is a 45 y.o. is seen today in office s/p right first MPJ arthrodesis preformed on 03/09/2021.  He states he is doing well.  Denies any recent injury or trauma.  Several random shooting pain at times of the incision area but otherwise been doing well.  He is back to wearing regular shoe.  No recent injury or trauma or changes otherwise.   Objective: General: No acute distress, AAOx3  DP/PT pulses palpable 2/4, CRT < 3 sec to all digits.  Protective sensation intact. Motor function intact.  Right foot: Incision is well coapted without any evidence of dehiscence.  Scar is well formed.  There is no tenderness palpation of the arthrodesis site.  Arthrodesis site appears to be stable.  Upon weightbearing the toe does sit dorsiflexed.  There is minimal swelling still evident but no erythema or warmth.  No other areas of discomfort. No pain with calf compression, swelling, warmth, erythema.   Assessment and Plan:  Status post right first MPJ arthrodesis, improving  -Treatment options discussed including all alternatives, risks, and complications -X-rays obtained reviewed.  Hardware intact.  The distal portion of the staple is holding the dorsal portion of the proximal phalanx.  No evidence of acute fracture otherwise.  Mild radiolucency is still evident across the arthrodesis site but does appear to have some healing noted. -At this point I discussed with him continue with stiffer supportive shoe gear.  He has orthotics that we may previously he can wear.  If he needs modifications to bring them in with him we can work on this.  Continue with compression as well as ice help with any postoperative edema.  Return in about 4 weeks (around 06/02/2021).  Repeat x-ray   Trula Slade DPM

## 2021-06-02 ENCOUNTER — Ambulatory Visit (INDEPENDENT_AMBULATORY_CARE_PROVIDER_SITE_OTHER): Payer: BC Managed Care – PPO

## 2021-06-02 ENCOUNTER — Other Ambulatory Visit: Payer: Self-pay

## 2021-06-02 ENCOUNTER — Ambulatory Visit (INDEPENDENT_AMBULATORY_CARE_PROVIDER_SITE_OTHER): Payer: BC Managed Care – PPO | Admitting: Podiatry

## 2021-06-02 DIAGNOSIS — Z9889 Other specified postprocedural states: Secondary | ICD-10-CM | POA: Diagnosis not present

## 2021-06-02 DIAGNOSIS — M722 Plantar fascial fibromatosis: Secondary | ICD-10-CM

## 2021-06-02 DIAGNOSIS — M2021 Hallux rigidus, right foot: Secondary | ICD-10-CM

## 2021-06-02 DIAGNOSIS — M2012 Hallux valgus (acquired), left foot: Secondary | ICD-10-CM

## 2021-06-02 NOTE — Patient Instructions (Signed)

## 2021-06-07 NOTE — Progress Notes (Signed)
Subjective: 45 year old male presents the office today for surgical consultation of left foot first MPJ pain.  He recently underwent right first MPJ fusion on Mar 09, 2021 he states he has done well from this.  He has no pain.  He has increased his walking and walk [place].  He does get some Planter fasciitis symptoms in the left side he reports.  He started stretching.  No recent injury or changes. Denies any systemic complaints such as fevers, chills, nausea, vomiting. No acute changes since last appointment, and no other complaints at this time.   Objective: AAO x3, NAD DP/PT pulses palpable bilaterally, CRT less than 3 seconds On the right side incision is well coapted.  There is no tenderness palpation on surgical site.  Trace edema.  No erythema or warmth.  No areas of discomfort of the right side identified today. On the left side there is pain with range of motion of first MPJ and there is crepitation with range of motion as well as restriction of motion.  He does get some discomfort along the insertion of the plantar fascia.  Infection appears to be intact.  No edema, erythema.  No other areas of discomfort.  MMT 5/5. No open lesions or pre-ulcerative lesions.  No pain with calf compression, swelling, warmth, erythema  Assessment: 45 year old male left foot first MPJ capsulitis/hallux rigidus, plantar fasciitis; status post right first MPJ fusion  Plan: -All treatment options discussed with the patient including all alternatives, risks, complications.  -X-rays obtained and reviewed bilaterally.  The right side status post first MPJ fusion with hardware intact and increased consolidation.  Left side arthritic changes present the first MPJ. -On the right side he is doing well and is back to wearing a regular shoe.  Discussed gradual increase activity level as tolerated.  He has not been having any issues on the right side and I will discharge of the postoperative  course in the right side and he agrees with this plan. -For the left side discussed with both conservative as well as surgical treatment options.  He is try conservative treatment supportive shoe modifications, offloading as well as orthotics and injections.  He was to proceed with surgical intervention given continued discomfort.  Discussed plan for first MPJ arthrodesis. -The incision placement as well as the postoperative course was discussed with the patient. I discussed risks of the surgery which include, but not limited to, infection, bleeding, pain, swelling, need for further surgery, delayed or nonhealing, painful or ugly scar, numbness or sensation changes, over/under correction, recurrence, transfer lesions, further deformity, hardware failure, DVT/PE, loss of toe/foot. Patient understands these risks and wishes to proceed with surgery. The surgical consent was reviewed with the patient all 3 pages were signed. No promises or guarantees were given to the outcome of the procedure. All questions were answered to the best of my ability. Before the surgery the patient was encouraged to call the office if there is any further questions. The surgery will be performed at the [facility] on an outpatient basis. -Troponin fasciitis discussed stretching, icing daily as well as wearing shoes and good arch supports.  Consider steroid injection. -Patient encouraged to call the office with any questions, concerns, change in symptoms.   Trula Slade DPM

## 2021-08-01 ENCOUNTER — Telehealth: Payer: Self-pay | Admitting: Urology

## 2021-08-01 NOTE — Telephone Encounter (Signed)
DOS - [id number]  HALLUX MPJ FUSION LEFT --- [id number]  [employer] EFFECTIVE DATE - 10/23/12  SPOKE WITH DEVINE F WITH [employer] AND SHE STATED FOR CPT CODE 83338 NO PRIOR AUTH IS REQUIRED.  REF# I - [id number]

## 2021-08-23 ENCOUNTER — Other Ambulatory Visit: Payer: Self-pay | Admitting: Podiatry

## 2021-08-23 MED ORDER — OXYCODONE-ACETAMINOPHEN 5-325 MG PO TABS: 1.0000 | ORAL_TABLET | Freq: Four times a day (QID) | ORAL | 0 refills | Status: DC | PRN

## 2021-08-23 MED ORDER — PROMETHAZINE HCL 25 MG PO TABS
25.0000 mg | ORAL_TABLET | Freq: Three times a day (TID) | ORAL | 0 refills | Status: DC | PRN
Start: 2021-08-23 — End: 2021-11-18

## 2021-08-23 MED ORDER — CEPHALEXIN 500 MG PO CAPS: 500.0000 mg | ORAL_CAPSULE | Freq: Three times a day (TID) | ORAL | 0 refills | Status: DC

## 2021-08-23 NOTE — Progress Notes (Signed)
Postop medications sent 

## 2021-08-24 ENCOUNTER — Encounter: Payer: Self-pay | Admitting: Podiatry

## 2021-08-24 DIAGNOSIS — M25572 Pain in left ankle and joints of left foot: Secondary | ICD-10-CM | POA: Diagnosis not present

## 2021-08-24 DIAGNOSIS — M2022 Hallux rigidus, left foot: Secondary | ICD-10-CM | POA: Diagnosis not present

## 2021-08-29 ENCOUNTER — Ambulatory Visit (INDEPENDENT_AMBULATORY_CARE_PROVIDER_SITE_OTHER): Payer: BC Managed Care – PPO | Admitting: Podiatry

## 2021-08-29 ENCOUNTER — Ambulatory Visit (INDEPENDENT_AMBULATORY_CARE_PROVIDER_SITE_OTHER): Payer: BC Managed Care – PPO

## 2021-08-29 ENCOUNTER — Other Ambulatory Visit: Payer: Self-pay

## 2021-08-29 ENCOUNTER — Other Ambulatory Visit: Payer: Self-pay | Admitting: Podiatry

## 2021-08-29 DIAGNOSIS — Z9889 Other specified postprocedural states: Secondary | ICD-10-CM

## 2021-08-29 DIAGNOSIS — M205X2 Other deformities of toe(s) (acquired), left foot: Secondary | ICD-10-CM

## 2021-08-29 MED ORDER — OXYCODONE-ACETAMINOPHEN 5-325 MG PO TABS: 1.0000 | ORAL_TABLET | Freq: Four times a day (QID) | ORAL | 0 refills | Status: DC | PRN

## 2021-08-29 NOTE — Progress Notes (Signed)
Subjective: Steve Salinas is a 45 y.o. is seen today in office s/p left 1st MTPJ arthrodesis preformed on 08/24/2021. He states his pain is controlled with the current pain medication. No injuries or other issues. Denies any systemic complaints such as fevers, chills, nausea, vomiting. No calf pain, chest pain, shortness of breath.   Objective: General: No acute distress, AAOx3  DP/PT pulses palpable 2/4, CRT < 3 sec to all digits.  Protective sensation intact. Motor function intact.  Left foot: Incision is well coapted without any evidence of dehiscence. There is no surrounding erythema, ascending cellulitis, fluctuance, crepitus, malodor, drainage/purulence. There is mild edema around the surgical site. There is mild pain along the surgical site.  Arthrodesis site is stable. No other areas of tenderness to bilateral lower extremities.  No other open lesions or pre-ulcerative lesions.  No pain with calf compression, swelling, warmth, erythema.   Assessment and Plan:  Status post left foot first MPJ arthrodesis, doing well with no complications   -Treatment options discussed including all alternatives, risks, and complications -X-rays obtained reviewed.  No evidence of acute fracture.  Status post first MPJ arthrodesis with hardware intact. -Small amount of antibiotic ointment was applied followed by dressing.  Keep dressing clean, dry, intact -Remain nonweightbearing cam boot. -Ice/elevation -Pain medication as needed. -Monitor for any clinical signs or symptoms of infection and DVT/PE and directed to call the office immediately should any occur or go to the ER. -Follow-up as scheduled or sooner if any problems arise. In the meantime, encouraged to call the office with any questions, concerns, change in symptoms.   Celesta Gentile, DPM

## 2021-09-04 ENCOUNTER — Other Ambulatory Visit: Payer: Self-pay | Admitting: Podiatry

## 2021-09-05 ENCOUNTER — Telehealth: Payer: Self-pay | Admitting: *Deleted

## 2021-09-05 ENCOUNTER — Other Ambulatory Visit: Payer: Self-pay | Admitting: Podiatry

## 2021-09-05 MED ORDER — OXYCODONE-ACETAMINOPHEN 5-325 MG PO TABS: 1.0000 | ORAL_TABLET | Freq: Four times a day (QID) | ORAL | 0 refills | Status: DC | PRN

## 2021-09-05 NOTE — Telephone Encounter (Signed)
Patient is calling for a refill of his pain medicine, not sleeping at night because of the pain.Please advise.

## 2021-09-06 NOTE — Telephone Encounter (Signed)
Returned the call to patient to notify that medication has been sent to pharmacy, said that he picked up and he slept much better last night.

## 2021-09-08 ENCOUNTER — Ambulatory Visit (INDEPENDENT_AMBULATORY_CARE_PROVIDER_SITE_OTHER): Payer: BC Managed Care – PPO | Admitting: Podiatry

## 2021-09-08 ENCOUNTER — Other Ambulatory Visit: Payer: Self-pay

## 2021-09-08 DIAGNOSIS — M205X2 Other deformities of toe(s) (acquired), left foot: Secondary | ICD-10-CM

## 2021-09-08 DIAGNOSIS — Z9889 Other specified postprocedural states: Secondary | ICD-10-CM

## 2021-09-11 NOTE — Progress Notes (Signed)
Subjective: Steve Salinas is a 45 y.o. is seen today in office s/p left 1st MTPJ arthrodesis preformed on 08/24/2021.  States he is feeling better and the pain is also improving.  He has not had any recent injury or falls.  He has no new concerns today.  No fevers or chills.    Objective: General: No acute distress, AAOx3  DP/PT pulses palpable 2/4, CRT < 3 sec to all digits.  Protective sensation intact. Motor function intact.  Left foot: Incision is well coapted without any evidence of dehiscence. There is no surrounding erythema, ascending cellulitis, fluctuance, crepitus, malodor, drainage/purulence.  Decreased edema.  No signs of infection.  Arthrodesis site is stable.  Toes in rectus position. No other open lesions or pre-ulcerative lesions.  No pain with calf compression, swelling, warmth, erythema.   Assessment and Plan:  Status post left foot first MPJ arthrodesis, doing well with no complications   -Treatment options discussed including all alternatives, risks, and complications -At this point discussed he can wash the foot with soap and water and dry thoroughly and apply a similar bandage.  This was applied today as well. -Remain nonweightbearing cam boot.  Next week he can start partial weightbearing. -Ice/elevation -Pain medication as needed. -Monitor for any clinical signs or symptoms of infection and DVT/PE and directed to call the office immediately should any occur or go to the ER. -Follow-up as scheduled or sooner if any problems arise. In the meantime, encouraged to call the office with any questions, concerns, change in symptoms.   *x-ray next appointment  Celesta Gentile, DPM

## 2021-09-22 ENCOUNTER — Other Ambulatory Visit: Payer: Self-pay

## 2021-09-22 ENCOUNTER — Ambulatory Visit (INDEPENDENT_AMBULATORY_CARE_PROVIDER_SITE_OTHER): Payer: BC Managed Care – PPO

## 2021-09-22 ENCOUNTER — Ambulatory Visit (INDEPENDENT_AMBULATORY_CARE_PROVIDER_SITE_OTHER): Payer: BC Managed Care – PPO | Admitting: Podiatry

## 2021-09-22 DIAGNOSIS — Z9889 Other specified postprocedural states: Secondary | ICD-10-CM

## 2021-09-22 DIAGNOSIS — M205X2 Other deformities of toe(s) (acquired), left foot: Secondary | ICD-10-CM

## 2021-09-22 HISTORY — PX: TOE FUSION: SHX1070

## 2021-09-23 ENCOUNTER — Ambulatory Visit (INDEPENDENT_AMBULATORY_CARE_PROVIDER_SITE_OTHER): Payer: BC Managed Care – PPO

## 2021-09-23 DIAGNOSIS — Z23 Encounter for immunization: Secondary | ICD-10-CM

## 2021-09-25 NOTE — Progress Notes (Signed)
Subjective: Steve Salinas is a 45 y.o. is seen today in office s/p left 1st MTPJ arthrodesis preformed on 08/24/2021.  States that overall he is doing better and his pain is improving.  Swelling is also improved.  He has more discomfort and pain.  Has some dryness of the skin but does not see any openings.  Been walking the cam boot.  No recent injuries.  No fevers or chills that he reports.  No other concerns.   Objective: General: No acute distress, AAOx3  DP/PT pulses palpable 2/4, CRT < 3 sec to all digits.  Protective sensation intact. Motor function intact.  Left foot: Incision is well coapted without any evidence of dehiscence.  Sutures are still intact which are Monocryl sutures which are removed today.  Incisions well coapted without any surrounding erythema, drainage or pus no signs of infection.  Arthrodesis site is stable.  No significant discomfort today.  Decreased edema. No other open lesions or pre-ulcerative lesions.  No pain with calf compression, swelling, warmth, erythema.   Assessment and Plan:  Status post left foot first MPJ arthrodesis, doing well with no complications   -Treatment options discussed including all alternatives, risks, and complications -X-rays obtained reviewed.  Hardware intact with any complicating factors.  This appears to be some increased consolidation across the arthrodesis site. -At this point continue the cam boot, ice and elevation.  I remove the remainder the sutures today without any complications incisions healing well.  Return in about 2 weeks (around 10/06/2021) for post-op visit, x-ray.  Trula Slade DPM

## 2021-10-06 ENCOUNTER — Other Ambulatory Visit: Payer: Self-pay

## 2021-10-06 ENCOUNTER — Ambulatory Visit (INDEPENDENT_AMBULATORY_CARE_PROVIDER_SITE_OTHER): Payer: BC Managed Care – PPO

## 2021-10-06 ENCOUNTER — Ambulatory Visit (INDEPENDENT_AMBULATORY_CARE_PROVIDER_SITE_OTHER): Payer: BC Managed Care – PPO | Admitting: Podiatry

## 2021-10-06 DIAGNOSIS — M2022 Hallux rigidus, left foot: Secondary | ICD-10-CM

## 2021-10-06 DIAGNOSIS — Z9889 Other specified postprocedural states: Secondary | ICD-10-CM

## 2021-10-06 NOTE — Progress Notes (Signed)
Subjective: Steve Salinas is a 45 y.o. is seen today in office s/p left 1st MTPJ arthrodesis preformed on 08/24/2021.  States he is not having significant pain but he gets some more aching sensation.  No recent injuries.  Still in the cam boot.  No new concerns today.  No fevers or chills, chest pain, shortness of breath.  No other concerns.  Objective: General: No acute distress, AAOx3  DP/PT pulses palpable 2/4, CRT < 3 sec to all digits.  Protective sensation intact. Motor function intact.  Left foot: Incision is well coapted without any evidence of dehiscence.  Scar is well formed.  There is decreased edema.  There is no surrounding erythema or any signs of infection.  The majority of tenderness is actually on the plantar aspect just proximal to the sesamoids and along the medial band of the plantar fascia distally, muscles.  No significant pain on the arthrodesis site to the arthrodesis site appears to be stable.  No other areas of discomfort.  No other open lesions or pre-ulcerative lesions.  No pain with calf compression, swelling, warmth, erythema.   Assessment and Plan:  Status post left foot first MPJ arthrodesis, doing well with no complications   -Treatment options discussed including all alternatives, risks, and complications -X-rays obtained reviewed.  Hardware intact any complicating factors.  There is some increased consolidation noted across the dorsal portion of the joint there is radiolucency on the plantar aspect on the lateral view. -Clinically he appears to be doing well.  Discussed with him starting to slowly transition back into regular shoe as tolerated.  Discussed gradual transition continue to ice and elevate as well.  Should there be any increase in pain return to the cam boot.  RTC 2-3 weeks or sooner if needed  Trula Slade DPM

## 2021-10-07 ENCOUNTER — Encounter: Payer: BC Managed Care – PPO | Admitting: Podiatry

## 2021-10-11 ENCOUNTER — Telehealth: Payer: Self-pay | Admitting: Podiatry

## 2021-10-11 NOTE — Telephone Encounter (Signed)
Pt left message asking about getting a new pair of orthotics like the ones he got in January. He was not sure if he needed an appt or not. He stated the ones he has is wearing out.  I returned call and left a message for pt to call me to discuss.

## 2021-10-27 ENCOUNTER — Other Ambulatory Visit: Payer: BC Managed Care – PPO

## 2021-10-27 ENCOUNTER — Other Ambulatory Visit: Payer: Self-pay

## 2021-10-27 ENCOUNTER — Ambulatory Visit (INDEPENDENT_AMBULATORY_CARE_PROVIDER_SITE_OTHER): Payer: BC Managed Care – PPO | Admitting: Podiatry

## 2021-10-27 ENCOUNTER — Ambulatory Visit (INDEPENDENT_AMBULATORY_CARE_PROVIDER_SITE_OTHER): Payer: BC Managed Care – PPO

## 2021-10-27 DIAGNOSIS — Z9889 Other specified postprocedural states: Secondary | ICD-10-CM | POA: Diagnosis not present

## 2021-10-27 DIAGNOSIS — M2022 Hallux rigidus, left foot: Secondary | ICD-10-CM | POA: Diagnosis not present

## 2021-10-30 NOTE — Progress Notes (Signed)
Subjective: Steve Salinas is a 46 y.o. is seen today in office s/p left 1st MTPJ arthrodesis preformed on 08/24/2021.  States that he is continue to improve he is back to going to regular shoe.  He states he is also getting around better.  Occasional discomfort still noted.  No recent injuries.  Denies any fevers or chills.  Has no other concerns today.   Objective: General: No acute distress, AAOx3  DP/PT pulses palpable 2/4, CRT < 3 sec to all digits.  Protective sensation intact. Motor function intact.  Left foot: Incision is well coapted without any evidence of dehiscence.  There is decreased edema present.  There is no surrounding erythema, drainage or pus or any signs of infection.  Arthrodesis site is stable.  No significant pain on exam today to the surgical site.  He has been getting more Planter fasciitis symptoms on the left side.  Tenderness on the plantar fascial insertion.  No pain about compression of calcaneus.  No other areas of pinpoint tenderness. No other open lesions or pre-ulcerative lesions.  No pain with calf compression, swelling, warmth, erythema.   Assessment and Plan:  Status post left foot first MPJ arthrodesis, doing well with no complications; Planter fasciitis  -Treatment options discussed including all alternatives, risks, and complications -X-rays obtained reviewed.  Hardware intact any complicating factors.  There is some increased consolidation noted across the dorsal portion of the joint there is radiolucency on the plantar aspect on the lateral view still noted -Continue with regular shoe gear discussed gradual increase in activity level.  Compression anklet to help with any postoperative edema.  Hopefully the plantar fasciitis symptoms will improve as he starts to have more normal gait.  Discussed stretching, icing daily.  Discussed formal physical therapy but he is not to continue with home therapy.  Trula Slade DPM

## 2021-10-31 ENCOUNTER — Ambulatory Visit: Payer: BC Managed Care – PPO

## 2021-10-31 ENCOUNTER — Other Ambulatory Visit: Payer: Self-pay

## 2021-10-31 DIAGNOSIS — Z9889 Other specified postprocedural states: Secondary | ICD-10-CM

## 2021-10-31 DIAGNOSIS — M2022 Hallux rigidus, left foot: Secondary | ICD-10-CM | POA: Diagnosis not present

## 2021-10-31 DIAGNOSIS — M2021 Hallux rigidus, right foot: Secondary | ICD-10-CM

## 2021-10-31 NOTE — Progress Notes (Signed)
SITUATION Reason for Consult: Evaluation for Bilateral Custom Foot Orthoses Patient / Caregiver Report: Patient has had foot orthotics all his life and needs support under his toes  OBJECTIVE DATA: Patient History / Diagnosis:    ICD-10-CM   1. Hallux rigidus, left foot  M20.22     2. Status post left foot surgery  Z98.890     3. Hallux rigidus, right foot  M20.21       Current or Previous Devices: sulcus length orthotics  Foot Examination: Skin presentation:   Intact Ulcers & Callousing:   None and no history Toe / Foot Deformities:  Pes cavus Weight Bearing Presentation:  cavus Sensation:    Intact  ORTHOTIC RECOMMENDATION Recommended Device: 1x pair of custom functional foot orthotics  GOALS OF ORTHOSES - Reduce Pain - Prevent Foot Deformity - Prevent Progression of Further Foot Deformity - Relieve Pressure - Improve the Overall Biomechanical Function of the Foot and Lower Extremity.  ACTIONS PERFORMED Patient was casted for Foot Orthoses via crush box. Procedure was explained and patient tolerated procedure well. All questions were answered and concerns addressed.  PLAN Potential out of pocket cost was communicated to patient. Casts are to be sent to [facility] for fabrication. Patient is to be called for fitting when devices are ready.

## 2021-11-18 ENCOUNTER — Encounter: Payer: Self-pay | Admitting: Family

## 2021-11-18 ENCOUNTER — Telehealth: Payer: BC Managed Care – PPO | Admitting: Family

## 2021-11-18 ENCOUNTER — Other Ambulatory Visit: Payer: Self-pay

## 2021-11-18 VITALS — Ht 69.0 in | Wt 230.6 lb

## 2021-11-18 DIAGNOSIS — U071 COVID-19: Secondary | ICD-10-CM | POA: Diagnosis not present

## 2021-11-18 MED ORDER — MOLNUPIRAVIR EUA 200MG CAPSULE: 4.0000 | ORAL_CAPSULE | Freq: Two times a day (BID) | ORAL | 0 refills | Status: AC

## 2021-11-18 NOTE — Assessment & Plan Note (Signed)
Sending Molnupiravir, pt advised of FDA label for emergency use, how to take, & SE. Advised of CDC guidelines for self isolation/ ending isolation.  Advised of safe practice guidelines. Symptom Tier reviewed.  Encouraged to monitor for any worsening symptoms; watch for increased shortness of breath, weakness, and signs of dehydration. Instructed to rest and hydrate well.  Advised to wear a mask if necessary to leave the house.

## 2021-11-18 NOTE — Progress Notes (Signed)
MyChart Video Visit    Virtual Visit via Video Note   This visit type was conducted due to national recommendations for restrictions regarding the COVID-19 Pandemic (e.g. social distancing) in an effort to limit this patient's exposure and mitigate transmission in our community. This patient is at least at moderate risk for complications without adequate follow up. This format is felt to be most appropriate for this patient at this time. Physical exam was limited by quality of the video and audio technology used for the visit. CMA was able to get the patient set up on a video visit.  Patient location: Home. Patient and provider in visit Provider location: Office  I discussed the limitations of evaluation and management by telemedicine and the availability of in person appointments. The patient expressed understanding and agreed to proceed.  Visit Date: 11/18/2021  Today's healthcare provider: Jeanie Sewer, NP     Subjective:    Patient ID: Steve Salinas, male    DOB: 1976/02/20, 46 y.o.   MRN: [id number]  Chief Complaint  Patient presents with   Covid Positive    Symptoms started Tuesday. Test positive on Tuesday. Complains of No smell or taste.    Nasal Congestion    He has tried an OTC sudafed that he states does not work.    Fever    Complains of fever mostly at night, with bad headache; 102-103    HPI Upper Respiratory Infection: Symptoms include congestion, fever 102-103, and nasal congestion.  Onset of symptoms was 3 days ago, gradually worsening since that time. He is drinking moderate amounts of fluids. Evaluation to date: none.  Treatment to date:  Tylenol, Sudafed .   Past Medical History:  Diagnosis Date   Allergy    Chronic allergic rhinitis    Gastritis and gastroduodenitis 10/10/2018   Bx-->reactive gastropathic changes in a background of chronic gastritis.  H pylori NEG.   History of adenomatous polyp of colon 01/2020   Recall 5 yrs   Hives 2019    Viral exanthem/postviral urticaria suspected.   Hypertension    Normal EKG in ED 06/09/2009   Migraine with aura    was on low dose topamax in the past (Dr. Ron Parker).  Most recent neuro eval 05/08/13 by Dr. Hassell Done at [facility] in [place]--started [place] ER 500mg  qd, diclofenac 25mg  delayed release tabs--1 q6h prn HA, ordered MRI brain (NORMAL 05/08/13) and sleep evaluation.   Nephrolithiasis    Obesity, Class I, BMI 30-34.9    OSA (obstructive sleep apnea)    uses cpap   Osteoarthritis of both ankles    Palpitations 10/2014   Dr. Acie Fredrickson 11/2014; PVCs and PACs on Holter.     Plantar fasciitis, left 10/2019   Sleep apnea    does not use the CPAP   Tendonitis, Achilles, left 10/2019    Past Surgical History:  Procedure Laterality Date   ACHILLES TENDON LENGTHENING  46 yrs old   CHOLECYSTECTOMY N/A 05/13/2015   Procedure: LAPAROSCOPIC CHOLECYSTECTOMY;  Surgeon: Coralie Keens, MD;  Location: [facility] OR;  Service: General;  Laterality: N/A;   COLONOSCOPY  02/02/2020   2021 adenomas x 2, recall 5 yrs.   HERNIA REPAIR Right 1994   inguinal   kidney stone removed  96   Dr. Risa Grill is urologist locally.   LITHOTRIPSY  1997 and 2005   TYMPANOSTOMY TUBE PLACEMENT  1981   UPPER GI ENDOSCOPY  10/10/2018   VASECTOMY      Outpatient Medications Prior to  Visit  Medication Sig Dispense Refill   cetirizine (ZYRTEC) 10 MG tablet Take 10 mg by mouth daily.     DUEXIS 800-26.6 MG TABS Take 1 tablet by mouth 3 (three) times daily.     irbesartan (AVAPRO) 150 MG tablet TAKE 1 TABLET DAILY (MUST HAVE OFFICE VISIT FOR FURTHER REFILLS) 90 tablet 3   cephALEXin (KEFLEX) 500 MG capsule Take 1 capsule (500 mg total) by mouth 3 (three) times daily. 21 capsule 0   fluticasone (FLONASE) 50 MCG/ACT nasal spray SPRAY ONE SPRAY BY BOTH NOSTRILS ROUTE DAILY.     oxyCODONE-acetaminophen (PERCOCET/ROXICET) 5-325 MG tablet Take 1-2 tablets by mouth every 6 (six) hours as needed for severe pain. 20 tablet  0   promethazine (PHENERGAN) 25 MG tablet Take 1 tablet (25 mg total) by mouth every 8 (eight) hours as needed for nausea or vomiting. 20 tablet 0   No facility-administered medications prior to visit.    No Known Allergies      Objective:     Physical Exam Vitals and nursing note reviewed.  Constitutional:      General: She is not in acute distress.    Appearance: Normal appearance.  HENT:     Head: Normocephalic.  Pulmonary:     Effort: No respiratory distress.  Musculoskeletal:     Cervical back: Normal range of motion.  Skin:    General: Skin is dry.     Coloration: Skin is not pale.  Neurological:     Mental Status: She is alert and oriented to person, place, and time.  Psychiatric:        Mood and Affect: Mood normal.   Ht 5\' 9"  (1.753 m)    Wt 230 lb 9.6 oz (104.6 kg)    BMI 34.05 kg/m   Wt Readings from Last 3 Encounters:  11/18/21 230 lb 9.6 oz (104.6 kg)  03/04/21 230 lb 8 oz (104.6 kg)  01/27/20 228 lb (103.4 kg)       Assessment & Plan:   Problem List Items Addressed This Visit       Other   COVID-19 - Primary    Sending Molnupiravir, pt advised of FDA label for emergency use, how to take, & SE. Advised of CDC guidelines for self isolation/ ending isolation.  Advised of safe practice guidelines. Symptom Tier reviewed.  Encouraged to monitor for any worsening symptoms; watch for increased shortness of breath, weakness, and signs of dehydration. Instructed to rest and hydrate well.  Advised to wear a mask if necessary to leave the house.       Relevant Medications   molnupiravir EUA (LAGEVRIO) 200 mg CAPS capsule    Meds ordered this encounter  Medications   molnupiravir EUA (LAGEVRIO) 200 mg CAPS capsule    Sig: Take 4 capsules (800 mg total) by mouth 2 (two) times daily for 5 days.    Dispense:  40 capsule    Refill:  0    Order Specific Question:   Supervising Provider    Answer:   ANDY, CAMILLE L [[id number]]    I discussed the assessment and  treatment plan with the patient. The patient was provided an opportunity to ask questions and all were answered. The patient agreed with the plan and demonstrated an understanding of the instructions.   The patient was advised to call back or seek an in-person evaluation if the symptoms worsen or if the condition fails to improve as anticipated.  I provided 24 minutes of face-to-face time  during this encounter.   Jeanie Sewer, NP [place] [phone] (phone) [phone] (fax)  [place]

## 2021-11-25 ENCOUNTER — Ambulatory Visit: Payer: BC Managed Care – PPO | Admitting: Family

## 2021-12-01 ENCOUNTER — Ambulatory Visit: Payer: BC Managed Care – PPO

## 2021-12-01 ENCOUNTER — Other Ambulatory Visit: Payer: Self-pay

## 2021-12-01 DIAGNOSIS — M2012 Hallux valgus (acquired), left foot: Secondary | ICD-10-CM

## 2021-12-01 DIAGNOSIS — M722 Plantar fascial fibromatosis: Secondary | ICD-10-CM

## 2021-12-01 DIAGNOSIS — M7751 Other enthesopathy of right foot: Secondary | ICD-10-CM

## 2021-12-01 DIAGNOSIS — M779 Enthesopathy, unspecified: Secondary | ICD-10-CM

## 2021-12-01 DIAGNOSIS — M2021 Hallux rigidus, right foot: Secondary | ICD-10-CM

## 2021-12-01 DIAGNOSIS — M205X2 Other deformities of toe(s) (acquired), left foot: Secondary | ICD-10-CM

## 2021-12-01 DIAGNOSIS — M778 Other enthesopathies, not elsewhere classified: Secondary | ICD-10-CM

## 2021-12-01 DIAGNOSIS — M205X1 Other deformities of toe(s) (acquired), right foot: Secondary | ICD-10-CM

## 2021-12-01 DIAGNOSIS — Z9889 Other specified postprocedural states: Secondary | ICD-10-CM

## 2021-12-01 DIAGNOSIS — M2022 Hallux rigidus, left foot: Secondary | ICD-10-CM

## 2021-12-01 NOTE — Progress Notes (Signed)
SITUATION: Reason for Visit: Fitting and Delivery of Custom Fabricated Foot Orthoses Patient Report: Patient reports comfort and is satisfied with device.  OBJECTIVE DATA: Patient History / Diagnosis:     ICD-10-CM   1. Hallux rigidus, left foot  M20.22     2. Status post left foot surgery  Z98.890     3. Hallux rigidus, right foot  M20.21     4. Hallux limitus, left  M20.5X2     5. Status post right foot surgery  Z98.890     6. Hallux valgus (acquired), left foot  M20.12     7. Plantar fasciitis, left  M72.2     8. Hallux limitus of right foot  M20.5X1     9. Capsulitis of right foot  M77.8     10. Bone spur of right foot  M77.51     11. Capsulitis  M77.9       Provided Device:  Custom Functional Foot Orthotics     DIRECTV [id number]  GOAL OF ORTHOSIS - Improve gait - Decrease energy expenditure - Improve Balance - Provide Triplanar stability of foot complex - Facilitate motion  ACTIONS PERFORMED Patient was fit with foot orthotics trimmed to shoe last. Patient tolerated fittign procedure.   Patient was provided with verbal and written instruction and demonstration regarding donning, doffing, wear, care, proper fit, function, purpose, cleaning, and use of the orthosis and in all related precautions and risks and benefits regarding the orthosis.  Patient was also provided with verbal instruction regarding how to report any failures or malfunctions of the orthosis and necessary follow up care. Patient was also instructed to contact our office regarding any change in status that may affect the function of the orthosis.  Patient demonstrated independence with proper donning, doffing, and fit and verbalized understanding of all instructions.  PLAN: Patient is to follow up in one week or as necessary (PRN). All questions were answered and concerns addressed. Plan of care was discussed with and agreed upon by the patient.

## 2021-12-07 ENCOUNTER — Other Ambulatory Visit: Payer: Self-pay

## 2021-12-07 ENCOUNTER — Ambulatory Visit: Payer: BC Managed Care – PPO

## 2021-12-07 DIAGNOSIS — M205X1 Other deformities of toe(s) (acquired), right foot: Secondary | ICD-10-CM

## 2021-12-07 DIAGNOSIS — M2012 Hallux valgus (acquired), left foot: Secondary | ICD-10-CM

## 2021-12-07 DIAGNOSIS — M2022 Hallux rigidus, left foot: Secondary | ICD-10-CM

## 2021-12-07 DIAGNOSIS — M19071 Primary osteoarthritis, right ankle and foot: Secondary | ICD-10-CM

## 2021-12-07 DIAGNOSIS — M722 Plantar fascial fibromatosis: Secondary | ICD-10-CM

## 2021-12-07 DIAGNOSIS — M205X2 Other deformities of toe(s) (acquired), left foot: Secondary | ICD-10-CM

## 2021-12-07 DIAGNOSIS — M778 Other enthesopathies, not elsewhere classified: Secondary | ICD-10-CM

## 2021-12-07 DIAGNOSIS — M2021 Hallux rigidus, right foot: Secondary | ICD-10-CM

## 2021-12-07 DIAGNOSIS — M7751 Other enthesopathy of right foot: Secondary | ICD-10-CM

## 2021-12-07 NOTE — Progress Notes (Signed)
SITUATION Reason for Consult: Follow-up with custom foot orthotics Patient / Caregiver Report: Patient reports the mortons extensions are causing pain and he would like the insoles reduced to 3/4 length.  OBJECTIVE DATA History / Diagnosis:    ICD-10-CM   1. Hallux rigidus, left foot  M20.22     2. Hallux rigidus, right foot  M20.21     3. Hallux limitus, left  M20.5X2     4. Hallux valgus (acquired), left foot  M20.12     5. Plantar fasciitis, left  M72.2     6. Hallux limitus of right foot  M20.5X1     7. Capsulitis of right foot  M77.8     8. Bone spur of right foot  M77.51     9. Osteoarthritis of first metatarsophalangeal (MTP) joint of right foot  M19.071       Change in Pathology: None  ACTIONS PERFORMED Patient's equipment was checked for structural stability and fit. Modified inserts as requested and patient is satisfied with fit and function. Device(s) intact and fit is excellent. All questions answered and concerns addressed.  PLAN Follow-up as needed (PRN). Plan of care discussed with and agreed upon by patient / caregiver.

## 2021-12-22 ENCOUNTER — Other Ambulatory Visit: Payer: Self-pay

## 2021-12-22 ENCOUNTER — Ambulatory Visit: Payer: BC Managed Care – PPO | Admitting: Podiatry

## 2021-12-22 ENCOUNTER — Ambulatory Visit (INDEPENDENT_AMBULATORY_CARE_PROVIDER_SITE_OTHER): Payer: BC Managed Care – PPO

## 2021-12-22 DIAGNOSIS — M2022 Hallux rigidus, left foot: Secondary | ICD-10-CM

## 2021-12-22 DIAGNOSIS — Z9889 Other specified postprocedural states: Secondary | ICD-10-CM | POA: Diagnosis not present

## 2021-12-22 DIAGNOSIS — L600 Ingrowing nail: Secondary | ICD-10-CM

## 2021-12-26 NOTE — Progress Notes (Signed)
Subjective: ?Steve Salinas is a 46 y.o. is seen today in office s/p left 1st MTPJ arthrodesis preformed on 08/24/2021.  States that he has been doing well he is increased his walking to about 4 to 5 miles.  He is getting some discomfort at the left big toenail but denies any drainage or pus itself.  Otherwise has been doing well.  No recent injury or changes.  No fevers or chills.  ? ?Objective: ?General: No acute distress, AAOx3  ?DP/PT pulses palpable 2/4, CRT < 3 sec to all digits.  ?Protective sensation intact. Motor function intact.  ?Left foot: Incision is well coapted without any evidence of dehiscence.  Toes in present position.  There is incurvation present both medial lateral aspects of the hallux toenail left side with mild tenderness but there is no drainage or pus or ascending cellulitis.  No signs of abscess.  Arthrodesis site is stable.  No areas of tenderness otherwise. ?No pain with calf compression, swelling, warmth, erythema.  ? ?Assessment and Plan:  ?Status post left foot first MPJ arthrodesis, doing well with no complications; Planter fasciitis ? ?-Treatment options discussed including all alternatives, risks, and complications ?-X-rays obtained reviewed.  Hardware intact any complicating factors.  There does appear to be increased consolidation noted across the arthrodesis site.  No evidence of acute fracture. ?-I do think the ingrown toenail is coming from wearing the stiffer soled and material boots.  Discussed nail trimming techniques.  If needed consider partial nail avulsion but over hold off on this today. ?-Continue increase activity level as tolerated. ? ?Return if symptoms worsen or fail to improve. ? ?Trula Slade DPM ?

## 2022-02-13 ENCOUNTER — Other Ambulatory Visit: Payer: Self-pay | Admitting: Family Medicine

## 2022-02-17 ENCOUNTER — Encounter: Payer: Self-pay | Admitting: Family Medicine

## 2022-02-17 ENCOUNTER — Ambulatory Visit: Payer: BC Managed Care – PPO | Admitting: Family Medicine

## 2022-02-17 VITALS — BP 117/73 | HR 66 | Temp 97.8°F | Ht 69.0 in | Wt 214.6 lb

## 2022-02-17 DIAGNOSIS — I10 Essential (primary) hypertension: Secondary | ICD-10-CM

## 2022-02-17 DIAGNOSIS — E782 Mixed hyperlipidemia: Secondary | ICD-10-CM | POA: Diagnosis not present

## 2022-02-17 DIAGNOSIS — R7303 Prediabetes: Secondary | ICD-10-CM

## 2022-02-17 MED ORDER — IRBESARTAN 150 MG PO TABS: ORAL_TABLET | ORAL | 3 refills | Status: DC

## 2022-02-17 NOTE — Progress Notes (Signed)
Office Note ?02/17/2022 ? ?CC:  ?Chief Complaint  ?Patient presents with  ? Hypertension  ?  Follow up; pt is not fasting  ? ?Patient is a 46 y.o. male who is here for annual health maintenance exam and follow-up hypertension. ?A/P as of last visit: ?"1) HTN: stable/well controlled on irbesartan '150mg'$  qd. ?Lytes/cr-future with fasting labs. ?  ?2) Health maintenance exam: ?Reviewed age and gender appropriate health maintenance issues (prudent diet, regular exercise, health risks of tobacco and excessive alcohol, use of seatbelts, fire alarms in home, use of sunscreen).  Also reviewed age and gender appropriate health screening as well as vaccine recommendations. ?Vaccines:  ALL UTD. ?Labs: cbc, cmet, flp, tsh, hep c screen ordered-->returning next week for labs when fasting. ?Prostate ca screening: average risk patient= as per latest guidelines, start screening at 68 yrs of age. ?Colon ca screening: adenomas 01/2020-->recall 2026." ? ?INTERIM HX: ?Steve Salinas is doing very well. ?He got toe surgery bilaterally--fusion. ?This has helped.  He has lost 15 pounds since I saw him last a year ago. ?He is cut out lots of carbs from his diet. ?Home blood pressures consistently less than 130/80. ? ? ?Past Medical History:  ?Diagnosis Date  ? Allergy   ? Chronic allergic rhinitis   ? Gastritis and gastroduodenitis 10/10/2018  ? Bx-->reactive gastropathic changes in a background of chronic gastritis.  H pylori NEG.  ? History of adenomatous polyp of colon 01/2020  ? Recall 5 yrs  ? Hives 2019  ? Viral exanthem/postviral urticaria suspected.  ? Hypertension   ? Normal EKG in ED 06/09/2009  ? Migraine with aura   ? was on low dose topamax in the past (Dr. Ron Parker).  Most recent neuro eval 05/08/13 by Dr. Hassell Done at [facility] in [place]--started [place] ER '500mg'$  qd, diclofenac '25mg'$  delayed release tabs--1 q6h prn HA, ordered MRI brain (NORMAL 05/08/13) and sleep evaluation.  ? Nephrolithiasis   ? Obesity, Class I, BMI  30-34.9   ? OSA (obstructive sleep apnea)   ? uses cpap  ? Osteoarthritis of both ankles   ? Palpitations 10/2014  ? Dr. Acie Fredrickson 11/2014; PVCs and PACs on Holter.    ? Plantar fasciitis, left 10/2019  ? Sleep apnea   ? does not use the CPAP  ? Tendonitis, Achilles, left 10/2019  ? ? ?Past Surgical History:  ?Procedure Laterality Date  ? ACHILLES TENDON LENGTHENING  46 yrs old  ? CHOLECYSTECTOMY N/A 05/13/2015  ? Procedure: LAPAROSCOPIC CHOLECYSTECTOMY;  Surgeon: Coralie Keens, MD;  Location: [facility];  Service: General;  Laterality: N/A;  ? COLONOSCOPY  02/02/2020  ? 2021 adenomas x 2, recall 5 yrs.  ? HERNIA REPAIR Right 1994  ? inguinal  ? kidney stone removed  1996  ? Dr. Risa Grill is urologist locally.  ? LITHOTRIPSY  1997 and 2005  ? TOE FUSION Right 03/23/2021  ? TOE FUSION Left 09/22/2021  ? TYMPANOSTOMY TUBE PLACEMENT  1981  ? UPPER GI ENDOSCOPY  10/10/2018  ? VASECTOMY    ? ? ?Family History  ?Problem Relation Age of Onset  ? Hypertension Mother   ? Hyperlipidemia Mother   ? Arthritis Mother   ? Heart attack Mother   ? Lung disease Mother   ? Arthritis Father   ? Hyperlipidemia Father   ? Hypertension Father   ? Heart disease Father   ?     stents  ? Hypertension Maternal Grandmother   ? Heart attack Maternal Grandfather   ? Lung disease Maternal Aunt   ?  Colon cancer Neg Hx   ? Colon polyps Neg Hx   ? Esophageal cancer Neg Hx   ? Rectal cancer Neg Hx   ? Stomach cancer Neg Hx   ? ? ?Social History  ? ?Socioeconomic History  ? Marital status: Married  ?  Spouse name: Not on file  ? Number of children: Not on file  ? Years of education: Not on file  ? Highest education level: Not on file  ?Occupational History  ? Not on file  ?Tobacco Use  ? Smoking status: Former  ?  Packs/day: 1.00  ?  Years: 11.00  ?  Pack years: 11.00  ?  Types: Cigarettes  ?  Quit date: 10/23/1998  ?  Years since quitting: 23.3  ? Smokeless tobacco: Never  ?Vaping Use  ? Vaping Use: Never used  ?Substance and Sexual Activity  ? Alcohol use: No   ? Drug use: No  ? Sexual activity: Not on file  ?Other Topics Concern  ? Not on file  ?Social History Narrative  ? Married, 4 young children (3 natural, 1 adopted from [place]).  ? 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  ? Originally from [place].  No tobacco, alc, or drugs.  ? No formal exercise but works in yard daily.  ? No OTC decongestants.  16 oz coffee/day.  ? ?Social Determinants of Health  ? ?Financial Resource Strain: Not on file  ?Food Insecurity: Not on file  ?Transportation Needs: Not on file  ?Physical Activity: Not on file  ?Stress: Not on file  ?Social Connections: Not on file  ?Intimate Partner Violence: Not on file  ? ? ?Outpatient Medications Prior to Visit  ?Medication Sig Dispense Refill  ? cetirizine (ZYRTEC) 10 MG tablet Take 10 mg by mouth daily.    ? DUEXIS 800-26.6 MG TABS Take 1 tablet by mouth 3 (three) times daily.    ? irbesartan (AVAPRO) 150 MG tablet TAKE 1 TABLET DAILY (MUST HAVE OFFICE VISIT FOR FURTHER REFILLS) 90 tablet 3  ? ?No facility-administered medications prior to visit.  ? ? ?No Known Allergies ? ?PE; ? ?  02/17/2022  ?  1:22 PM 11/18/2021  ?  1:43 PM 03/04/2021  ? 11:24 AM  ?Vitals with BMI  ?Height '5\' 9"'$  '5\' 9"'$  '5\' 9"'$   ?Weight 214 lbs 10 oz 230 lbs 10 oz 230 lbs 8 oz  ?BMI 31.68 34.04 34.02  ?Systolic 497  026  ?Diastolic 73  83  ?Pulse 66  71  ? ? ?Gen: Alert, well appearing.  Patient is oriented to person, place, time, and situation. ?AFFECT: pleasant, lucid thought and speech. ?CV: RRR, no m/r/g.   ?LUNGS: CTA bilat, nonlabored resps, good aeration in all lung fields. ?EXT: no clubbing or cyanosis.  no edema.  ? ?Pertinent labs:  ?Lab Results  ?Component Value Date  ? TSH 2.42 03/07/2021  ? ?Lab Results  ?Component Value Date  ? WBC 5.3 03/07/2021  ? HGB 14.7 03/07/2021  ? HCT 43.7 03/07/2021  ? MCV 88.5 03/07/2021  ? PLT 203 03/07/2021  ? ?Lab Results  ?Component Value Date  ? CREATININE 1.12 03/07/2021  ? BUN 20 03/07/2021  ? NA 134 (L) 03/07/2021  ? K 4.6  03/07/2021  ? CL 100 03/07/2021  ? CO2 23 03/07/2021  ? ?Lab Results  ?Component Value Date  ? ALT 24 03/07/2021  ? AST 21 03/07/2021  ? ALKPHOS 104 12/24/2019  ? BILITOT 0.6 03/07/2021  ? ?Lab Results  ?Component Value Date  ?  CHOL 178 03/07/2021  ? ?Lab Results  ?Component Value Date  ? HDL 29 (L) 03/07/2021  ? ?Lab Results  ?Component Value Date  ? LDLCALC 118 (H) 03/07/2021  ? ?Lab Results  ?Component Value Date  ? TRIG 193 (H) 03/07/2021  ? ?Lab Results  ?Component Value Date  ? CHOLHDL 6.1 (H) 03/07/2021  ? ?ASSESSMENT AND PLAN:  ? ?#1 hypertension, well controlled on irbesartan 150 mg a day. ?Future electrolytes and creatinine ordered.  Refilled medication today. ? ?2.  Mixed hyperlipidemia.  He has been doing better with carb intake.  Future of lipid panel and hepatic panel ordered----when fasting. ? ?3.  Prediabetes.  Dietary improvements as per above. ?Ordered future A1c. ? ?An After Visit Summary was printed and given to the patient. ? ?FOLLOW UP:  Return in about 1 year (around 02/18/2023) for annual CPE (fasting). ? ?Signed:  Crissie Sickles, MD           02/17/2022 ? ?

## 2022-02-20 ENCOUNTER — Ambulatory Visit (INDEPENDENT_AMBULATORY_CARE_PROVIDER_SITE_OTHER): Payer: BC Managed Care – PPO

## 2022-02-20 DIAGNOSIS — R7303 Prediabetes: Secondary | ICD-10-CM

## 2022-02-20 DIAGNOSIS — I10 Essential (primary) hypertension: Secondary | ICD-10-CM

## 2022-02-20 LAB — COMPREHENSIVE METABOLIC PANEL
ALT: 23 U/L (ref 0–53)
AST: 20 U/L (ref 0–37)
Albumin: 4.3 g/dL (ref 3.5–5.2)
Alkaline Phosphatase: 89 U/L (ref 39–117)
BUN: 19 mg/dL (ref 6–23)
CO2: 25 mEq/L (ref 19–32)
Calcium: 8.8 mg/dL (ref 8.4–10.5)
Chloride: 100 mEq/L (ref 96–112)
Creatinine, Ser: 1.06 mg/dL (ref 0.40–1.50)
GFR: 83.18 mL/min (ref >60.00)
Glucose, Bld: 90 mg/dL (ref 70–99)
Potassium: 4.4 mEq/L (ref 3.5–5.1)
Sodium: 133 mEq/L — ABNORMAL LOW (ref 135–145)
Total Bilirubin: 0.5 mg/dL (ref 0.2–1.2)
Total Protein: 7.1 g/dL (ref 6.0–8.3)

## 2022-02-20 LAB — CBC
HCT: 41.4 % (ref 39.0–52.0)
Hemoglobin: 14.4 g/dL (ref 13.0–17.0)
MCHC: 34.7 g/dL (ref 30.0–36.0)
MCV: 87.2 fl (ref 78.0–100.0)
Platelets: 191 10*3/uL (ref 150.0–400.0)
RBC: 4.75 Mil/uL (ref 4.22–5.81)
RDW: 14 % (ref 11.5–15.5)
WBC: 4.8 10*3/uL (ref 4.0–10.5)

## 2022-02-20 LAB — HEMOGLOBIN A1C: Hgb A1c MFr Bld: 5 % (ref 4.6–6.5)

## 2022-02-20 LAB — LIPID PANEL
Cholesterol: 209 mg/dL — ABNORMAL HIGH (ref 0–200)
HDL: 35 mg/dL — ABNORMAL LOW (ref >39.00)
LDL Cholesterol: 146 mg/dL — ABNORMAL HIGH (ref 0–99)
NonHDL: 173.86
Total CHOL/HDL Ratio: 6
Triglycerides: 141 mg/dL (ref 0.0–149.0)
VLDL: 28.2 mg/dL (ref 0.0–40.0)

## 2022-02-20 LAB — TSH: TSH: 2.45 u[IU]/mL (ref 0.35–5.50)

## 2022-05-16 ENCOUNTER — Telehealth: Payer: Self-pay

## 2022-05-16 NOTE — Telephone Encounter (Signed)
Type of forms received:  Employer Wellness Form   Routed to:  [employer] received by :   Hinton Dyer   Individual made aware of 3-5 business day turn around (Y/N): n/a  Form completed and patient made aware of charges(Y/N): n/a   Faxed to :  Please fax to highlighted number listed on form. Please call patient when ready.  Form location:   [place] inbox

## 2022-05-17 IMAGING — DX DG FOOT COMPLETE 3+V*R*
3 series · 3 of 3 positions shown · non-contrast
Comparison: Plain films right foot 01/02/2020.

CLINICAL DATA: Right hallux rigidus.

EXAM:
RIGHT FOOT COMPLETE - 3+ VIEW

[foot ap]
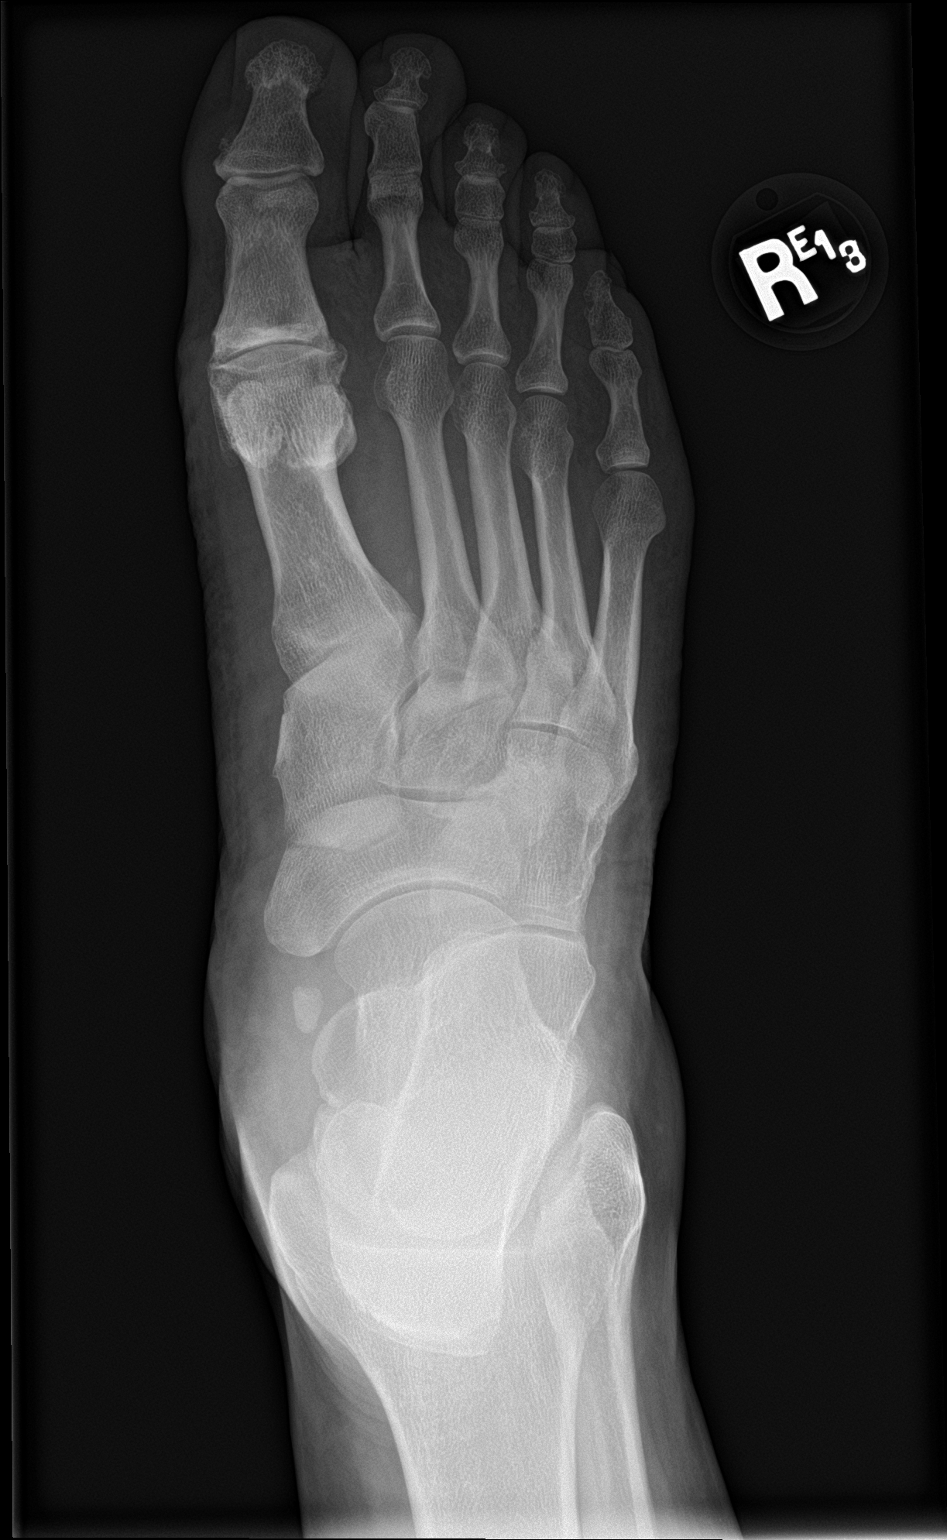

[foot obl]
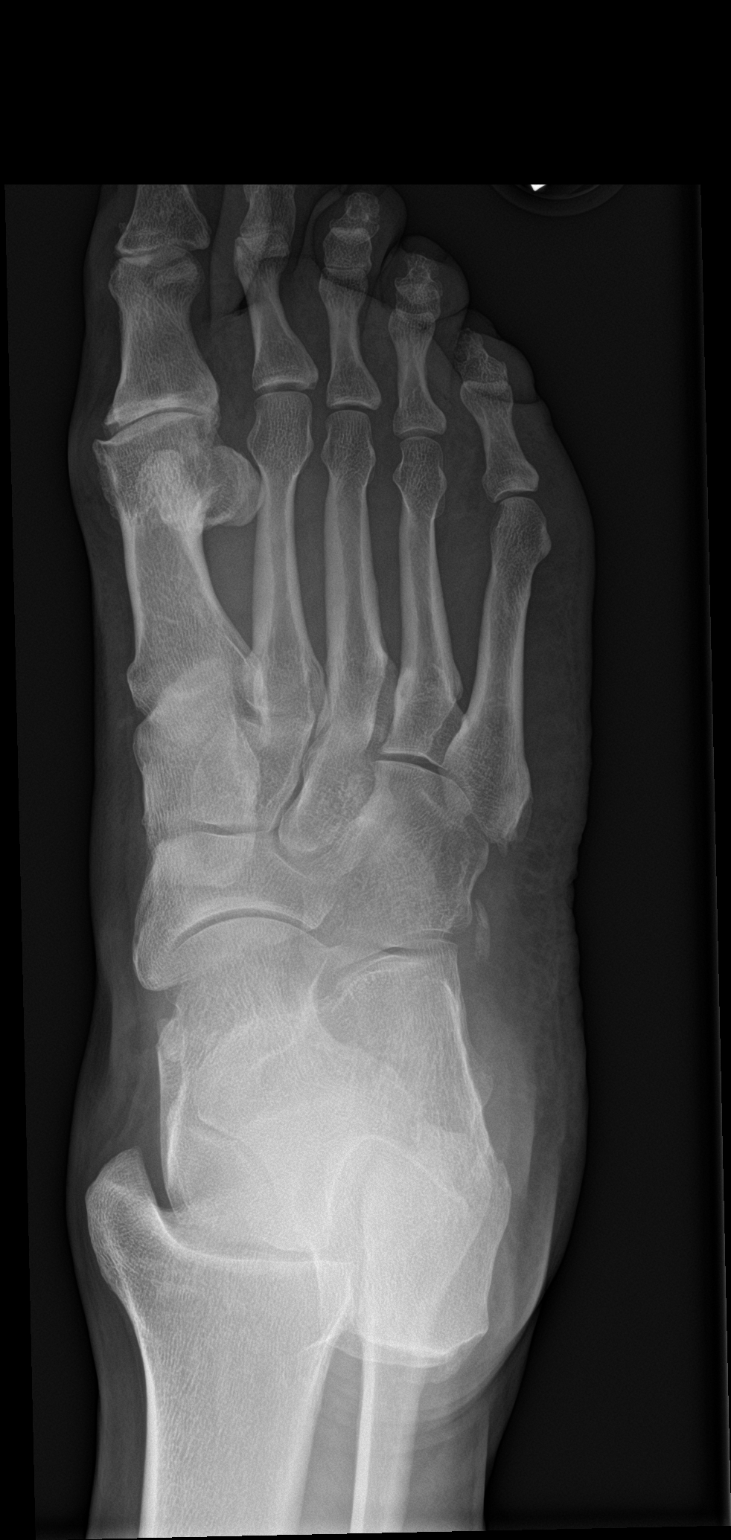

[foot lat]
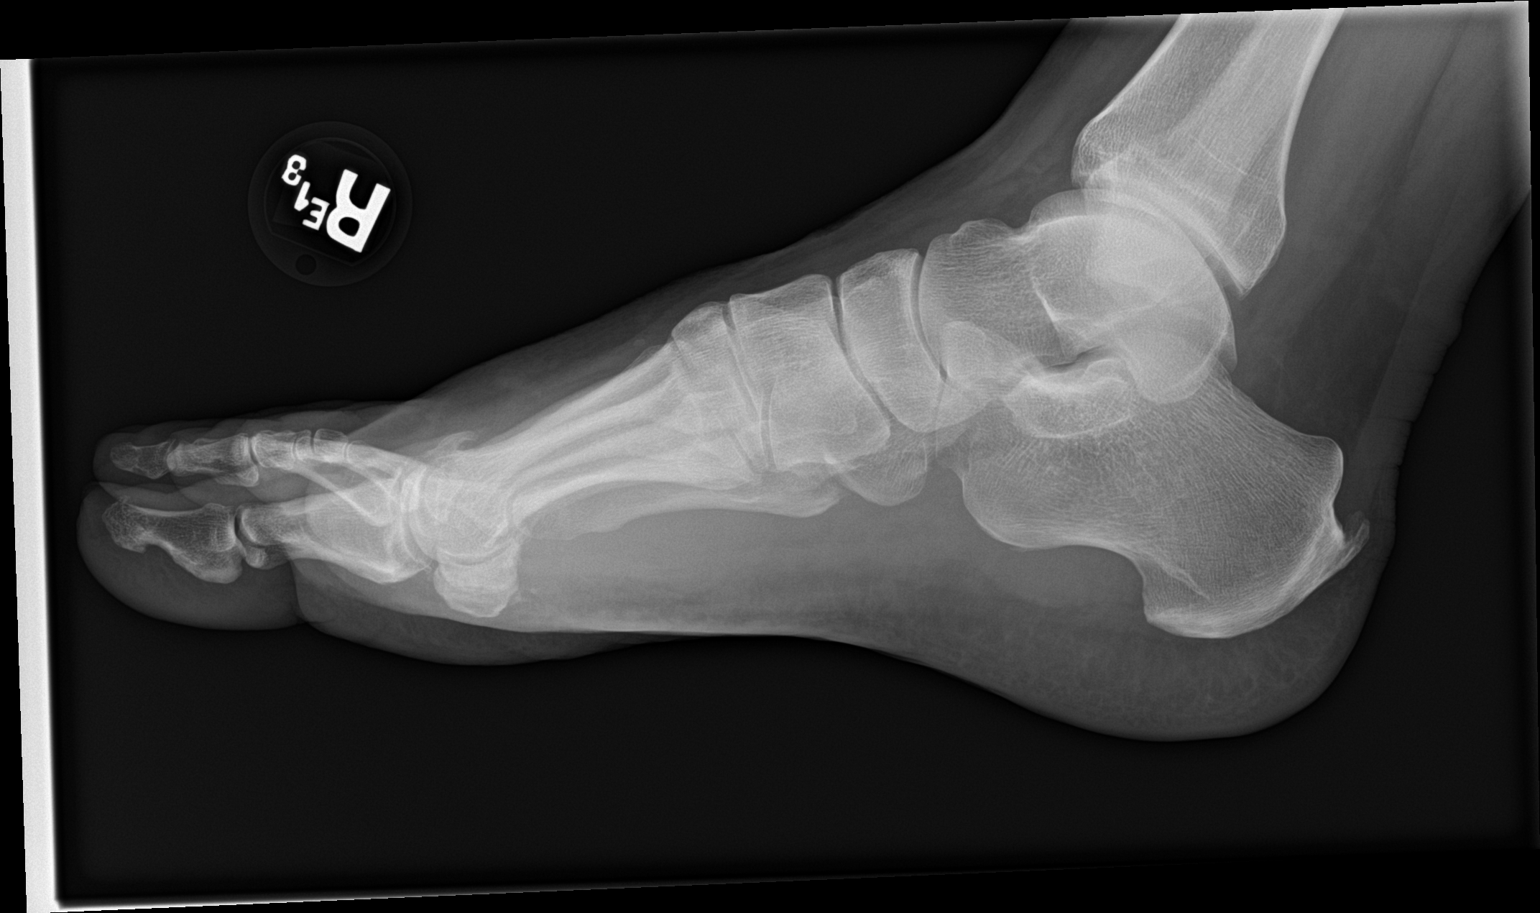

[3 of 3 positions shown; findings below may reference images not displayed]

FINDINGS: Severe first MTP osteoarthritis with bone-on-bone joint space
narrowing is again seen. Bulky osteophytosis about the joint is
worst off the dorsal margin of the first metatarsal head where it
has worsened since the prior study. Joint spaces are otherwise
maintained. No fracture or dislocation. Small accessory ossicle of
the navicular noted. There is some spurring at the Achilles tendon
insertion, unchanged.
IMPRESSION: Severe first MTP osteoarthritis. Bulky osteophyte off the dorsal
margin of the first metatarsal head has worsened since the prior
exam.

## 2022-05-17 NOTE — Telephone Encounter (Signed)
Signed and put in box to go up front.  

## 2022-05-17 NOTE — Telephone Encounter (Signed)
Placed on PCP desk to review and sign, if appropriate.  

## 2022-05-18 NOTE — Telephone Encounter (Signed)
Patient was advised form completed and will be coming by to pick up today.

## 2023-02-08 ENCOUNTER — Other Ambulatory Visit: Payer: Self-pay | Admitting: Family Medicine

## 2023-05-16 DIAGNOSIS — Z125 Encounter for screening for malignant neoplasm of prostate: Secondary | ICD-10-CM | POA: Diagnosis not present

## 2023-05-16 DIAGNOSIS — Z0000 Encounter for general adult medical examination without abnormal findings: Secondary | ICD-10-CM | POA: Diagnosis not present

## 2023-05-16 DIAGNOSIS — I10 Essential (primary) hypertension: Secondary | ICD-10-CM | POA: Diagnosis not present

## 2023-08-09 DIAGNOSIS — G4733 Obstructive sleep apnea (adult) (pediatric): Secondary | ICD-10-CM | POA: Diagnosis not present

## 2023-08-10 DIAGNOSIS — G4733 Obstructive sleep apnea (adult) (pediatric): Secondary | ICD-10-CM | POA: Diagnosis not present

## 2023-08-30 DIAGNOSIS — G4733 Obstructive sleep apnea (adult) (pediatric): Secondary | ICD-10-CM | POA: Diagnosis not present

## 2023-09-27 DIAGNOSIS — G4733 Obstructive sleep apnea (adult) (pediatric): Secondary | ICD-10-CM | POA: Diagnosis not present

## 2023-11-01 DIAGNOSIS — Z85828 Personal history of other malignant neoplasm of skin: Secondary | ICD-10-CM | POA: Diagnosis not present

## 2023-11-01 DIAGNOSIS — L821 Other seborrheic keratosis: Secondary | ICD-10-CM | POA: Diagnosis not present

## 2023-11-01 DIAGNOSIS — D2262 Melanocytic nevi of left upper limb, including shoulder: Secondary | ICD-10-CM | POA: Diagnosis not present

## 2023-11-01 DIAGNOSIS — D2261 Melanocytic nevi of right upper limb, including shoulder: Secondary | ICD-10-CM | POA: Diagnosis not present

## 2023-11-14 ENCOUNTER — Ambulatory Visit: Payer: BC Managed Care – PPO | Admitting: Podiatrist

## 2023-11-14 ENCOUNTER — Encounter: Payer: Self-pay | Admitting: Podiatrist

## 2023-11-14 DIAGNOSIS — L600 Ingrowing nail: Secondary | ICD-10-CM | POA: Diagnosis not present

## 2023-11-14 MED ORDER — DOXYCYCLINE HYCLATE 100 MG PO TABS: 100.0000 mg | ORAL_TABLET | Freq: Two times a day (BID) | ORAL | 0 refills | Status: DC

## 2023-11-14 NOTE — Patient Instructions (Signed)
Soak Instructions    THE DAY AFTER THE PROCEDURE  Place 1/4 cup of epsom salts in a quart of warm tap water.  Submerge your foot or feet with outer bandage intact for the initial soak; this will allow the bandage to become moist and wet for easy lift off.  Once you remove your bandage, continue to soak in the solution for 20 minutes.  .  Next, remove your foot or feet from solution, blot dry the affected area and cover.  Apply mupirocin ointment (RX), or polysporin   You may use a band aid large enough to cover the area or use gauze and tape.    **This soak should be done twice a day for the next 5-7 days.- after that time you may switch to keeping it clean with soap and water in the shower.  You may discontinue use of the bandaid at night after about 2 weeks.  You may discontinue use of the bandaid during the day in shoes when there is no drainage on the bandaid.   IF YOUR SKIN BECOMES IRRITATED WHILE USING THESE INSTRUCTIONS, IT IS OKAY TO SWITCH TO  antibacterial soap pump soap (Dial)  and water to keep the toe clean instead of soaking in epsom salts.      Long Term Care Instructions-Post Nail Surgery  You have had your ingrown toenail and root treated with a chemical.  This chemical causes a burn that will drain and ooze like a blister.  1-2 weeks after the procedure you may leave the area open to air at night to help dry it up.  During the day,  It is important to keep this area clean and covered until the toe dries out and forms a scab. Once the scab forms you no longer need to soak or apply a dressing.  If at any time you experience an increase in pain, redness, swelling, or drainage, you should contact the office as soon as possible.  

## 2023-11-14 NOTE — Patient Instructions (Signed)

## 2023-11-14 NOTE — Progress Notes (Signed)
Chief Complaint  Patient presents with   Toe Pain    Hallux left - both borders, feels like nail may be ingrown, has had a joint fusion and feels he's walking different and extra pressure is at the nail, only bothersome after walking for long periods, but usually the pain goes away after a few days   New Patient (Initial Visit)    Est pt 2023     HPI: Patient is 48 y.o. male who presents today for discomfort on the borders of bilateral great toenails.  He relates that after his toe fusion surgery he noticed his nails have become irritated after any significant activity/  hiking or long walks.  Denies any sign of infection of the toes.  Left great toe is more symptomatic than right.    Patient Active Problem List   Diagnosis Date Noted   COVID-19 11/18/2021   Abnormal weight loss 03/02/2021   Change in bowel habit 03/02/2021   Cholelithiasis 03/02/2021   Right upper quadrant pain 03/02/2021   Palpitations 12/02/2014   Multiple melanocytic nevi 09/26/2013   Health maintenance examination 07/11/2013   Headache, classical migraine 06/12/2011   HTN (hypertension), benign 04/03/2011    Current Outpatient Medications on File Prior to Visit  Medication Sig Dispense Refill   irbesartan (AVAPRO) 150 MG tablet TAKE 1 TABLET DAILY 90 tablet 3   No current facility-administered medications on file prior to visit.    No Known Allergies  Review of Systems No fevers, chills, nausea, muscle aches, no difficulty breathing, no calf pain, no chest pain or shortness of breath.   Physical Exam  GENERAL APPEARANCE: Alert, conversant. Appropriately groomed. No acute distress.   VASCULAR: Pedal pulses palpable 2/4 DP and 2/4 PT bilateral.  Capillary refill time is immediate to all digits,  Proximal to distal cooling is warm to warm.  Digital perfusion adequate.   NEUROLOGIC: sensation is intact to 5.07 monofilament at 5/5 sites bilateral.  Light touch is intact bilateral, vibratory sensation  intact bilateral  MUSCULOSKELETAL: acceptable muscle strength, tone and stability bilateral.  No gross boney pedal deformities noted.  No pain, crepitus or limitation noted with foot and ankle range of motion bilateral.   DERMATOLOGIC: skin is warm, supple, and dry.  Color, texture, and turgor of skin within normal limits.  No open wounds are noted.    Hallux nails are mildly incurvated medial and lateral borders with discomfort with medial to lateral compression left hallux more symptomatic than right.  No redness, no swelling, no sign of infection present.    Assessment     ICD-10-CM   1. Ingrown left big toenail  L60.0        Plan  Treatment options and alternatives were discussed. Recommended a permanent removal of the medial and lateral nail border of the left hallux nail-   Patient agreed. Skin was prepped with alcohol and a local injection of lidocaine and Marcaine plain was infiltrated to anesthetize the toe. The toe was then prepped with Betadine and exsanguinated. The offending nail border was removed and phenol applied to the exposed matrix tissue.  The area was then cleansed well with alcohol.  Antibiotic ointment and a dressing was then applied the tourniquet released  noting a prompt hyperemic response to the tip of the toe.  Oral and written instructions were dispensed and the patient was instructed on aftercare.  If there is any increased redness, swelling, drainage, pus or any other concerns arise, he will call.  He is interested in also having his right great toenail fixed-  he will return in a week to 2 weeks for this procedure.  Also I did put him on 5 days of doxycycline since his toe fusion.

## 2023-11-30 ENCOUNTER — Ambulatory Visit: Payer: BC Managed Care – PPO | Admitting: Podiatry

## 2023-11-30 ENCOUNTER — Encounter: Payer: Self-pay | Admitting: Podiatry

## 2023-11-30 DIAGNOSIS — L600 Ingrowing nail: Secondary | ICD-10-CM

## 2023-11-30 NOTE — Patient Instructions (Signed)

## 2023-12-05 NOTE — Progress Notes (Signed)
 Subjective: Chief Complaint  Patient presents with   Nail Problem    RM#12 Follow up on nail removal patient states heeled up good and has no concerns today.   48 year old male presents the office today for follow-up evaluation status post left partial nail avulsion.  States he is doing well any pain.  States he still gets some occasional discomfort in the right foot MRI to proceed with having the nail corners removed on the right side big toe today.  Currently denies any drainage or pus.  From a surgery perspective he states he is doing well.  Objective: AAO x3, NAD DP/PT pulses palpable bilaterally, CRT less than 3 seconds Status post partial nail avulsion left hallux which appears to be healing well.  Some amount of scabbing still present but there is no drainage or pus.  No cellulitis or signs of infection today. On the right hallux there is incurvation present of the nail borders with localized edema and mild erythema likely more from inflammation as opposed to infection.  There is no ascending cellulitis.  No drainage or pus.  Tenderness palpation of the nail borders. No pain with calf compression, swelling, warmth, erythema  Assessment: Ingrown toenail right hallux, status post left partial nail avulsion healing well  Plan: -All treatment options discussed with the patient including all alternatives, risks, complications.  -Left side is healing well.  Continue wash with soap and water, dry thoroughly.  I would continue this or subcu Epsom salts until scabs come off completely. -At this time, the patient is requesting partial nail removal with chemical matricectomy to the symptomatic portion of the nail. Risks and complications were discussed with the patient for which they understand and written consent was obtained. Under sterile conditions a total of 3 mL of a mixture of 2% lidocaine  plain and 0.5% Marcaine  plain was infiltrated in a hallux block fashion. Once anesthetized, the skin  was prepped in sterile fashion. A tourniquet was then applied. Next the central borders of the right hallux nail border was then sharply excised making sure to remove the entire offending nail border. Once the nails were ensured to be removed area was debrided and the underlying skin was intact. There is no purulence identified in the procedure. Next phenol was then applied under standard conditions and copiously irrigated.  Silvadene was applied. A dry sterile dressing was applied. After application of the dressing the tourniquet was removed and there is found to be an immediate capillary refill time to the digit. The patient tolerated the procedure well any complications. Post procedure instructions were discussed the patient for which he verbally understood. Discussed signs/symptoms of infection and directed to call the office immediately should any occur or go directly to the emergency room. In the meantime, encouraged to call the office with any questions, concerns, changes symptoms. -Patient encouraged to call the office with any questions, concerns, change in symptoms.   Donnice JONELLE Fees DPM

## 2023-12-10 ENCOUNTER — Telehealth: Payer: Self-pay

## 2023-12-10 ENCOUNTER — Other Ambulatory Visit: Payer: Self-pay | Admitting: Podiatry

## 2023-12-10 MED ORDER — DOXYCYCLINE HYCLATE 100 MG PO TABS: 100.0000 mg | ORAL_TABLET | Freq: Two times a day (BID) | ORAL | 0 refills | Status: DC

## 2023-12-10 NOTE — Telephone Encounter (Signed)
 Patient called and left a message - his toe looks infected after ingrown procedure. When he had his other toe done, he ended up taking doxycycline. He would like this called into [facility] in [place]

## 2024-03-14 ENCOUNTER — Other Ambulatory Visit: Payer: Self-pay | Admitting: Podiatry

## 2024-03-14 ENCOUNTER — Encounter: Payer: Self-pay | Admitting: Podiatry

## 2024-03-14 ENCOUNTER — Telehealth: Payer: Self-pay | Admitting: Podiatry

## 2024-03-14 MED ORDER — CEPHALEXIN 500 MG PO CAPS: 500.0000 mg | ORAL_CAPSULE | Freq: Three times a day (TID) | ORAL | 0 refills | Status: DC

## 2024-03-14 NOTE — Telephone Encounter (Signed)
 May possibly have infected ingrown toenail L foot 1st toe. He is going to upload a picture. In case you can call in something pharmacy is below. If need to be seen, your 1st avail is June 16th. Please advise how to sch appt. TY  [id number] - [place], [place] - [address] Phone: [phone]  Fax: [phone]

## 2024-03-14 NOTE — Telephone Encounter (Signed)
 Patient informed of prescription sent. Per Dr. Clydia Dart patient needs to be added to schedule for Tuesday at 315 pm.

## 2024-03-18 ENCOUNTER — Ambulatory Visit: Admitting: Podiatry

## 2024-03-18 ENCOUNTER — Telehealth: Payer: Self-pay | Admitting: Podiatry

## 2024-03-18 DIAGNOSIS — L600 Ingrowing nail: Secondary | ICD-10-CM | POA: Diagnosis not present

## 2024-03-18 NOTE — Patient Instructions (Signed)

## 2024-03-18 NOTE — Progress Notes (Unsigned)
 Subjective: Chief Complaint  Patient presents with   Foot Pain    Patient is here for infection of the left hallux toe nail. Pt has received some relief from the antibiotics, pt states " the toe just itches."   48 year old male presents the office today for palp evaluation of ingrown toenail left big toe.  States that since starting the antibiotics has been doing much better.  He notes a minimal pus along the lateral nail border but not currently.  No pain at time.   Objective: AAO x3, NAD DP/PT pulses palpable bilaterally, CRT less than 3 seconds Mild incurvation present nail border currently there is no significant tenderness there is no edema, drainage or pus or any signs of infection.  Otherwise.  There is no ascending cellulitis. No pain with calf compression, swelling, warmth, erythema  Assessment: Ingrown toenail  Plan: -All treatment options discussed with the patient including all alternatives, risks, complications.  -Since on the antibiotics her symptoms have improved.  We discussed repeat partial nail avulsion but we mutually agreed to hold off on this today.  Continue course of antibiotics.  Debrided without any complications or bleeding.  If symptoms persist or worsen will likely need him. -Patient encouraged to call the office with any questions, concerns, change in symptoms.   Charity Conch DPM

## 2024-03-18 NOTE — Telephone Encounter (Signed)
 Pt called to confirm that he can come in at 3:15 today.He states he spoke with Nurse Friday 5/23 and was offered this day/time.

## 2024-03-19 NOTE — Telephone Encounter (Signed)
 PT was sch'ed and saw Dr on 5/27

## 2024-04-01 ENCOUNTER — Ambulatory Visit (INDEPENDENT_AMBULATORY_CARE_PROVIDER_SITE_OTHER): Admitting: Family Medicine

## 2024-04-01 ENCOUNTER — Encounter: Payer: Self-pay | Admitting: Family Medicine

## 2024-04-01 VITALS — BP 118/70 | HR 69 | Temp 99.7°F | Ht 69.25 in | Wt 235.4 lb

## 2024-04-01 DIAGNOSIS — Z0000 Encounter for general adult medical examination without abnormal findings: Secondary | ICD-10-CM | POA: Diagnosis not present

## 2024-04-01 DIAGNOSIS — Z23 Encounter for immunization: Secondary | ICD-10-CM | POA: Diagnosis not present

## 2024-04-01 DIAGNOSIS — Z125 Encounter for screening for malignant neoplasm of prostate: Secondary | ICD-10-CM

## 2024-04-01 DIAGNOSIS — I10 Essential (primary) hypertension: Secondary | ICD-10-CM

## 2024-04-01 MED ORDER — IRBESARTAN 150 MG PO TABS: ORAL_TABLET | ORAL | 3 refills | Status: AC

## 2024-04-01 NOTE — Progress Notes (Signed)
 Office Note 04/01/2024  CC:  Chief Complaint  Patient presents with   Annual Exam    Pt is not fasting   Patient is a 48 y.o. male who is here for annual health maintenance exam and follow-up hypertension. I last saw him in April 2023 and he was doing well, stayed on irbesartan  150 mg a day.  INTERIM HX: Jearlean Mince is doing good.  Feels well.  He has started working out again the last few weeks.  He does take his blood pressure medication daily. Home blood pressure consistently less than 130/80.   Past Medical History:  Diagnosis Date   Allergy    Chronic allergic rhinitis    Gastritis and gastroduodenitis 10/10/2018   Bx-->reactive gastropathic changes in a background of chronic gastritis.  H pylori NEG.   History of adenomatous polyp of colon 01/2020   Recall 5 yrs   Hives 2019   Viral exanthem/postviral urticaria suspected.   Hypertension    Normal EKG in ED 06/09/2009   Migraine with aura    was on low dose topamax in the past (Dr. Wilmer Hash).  Most recent neuro eval 05/08/13 by Dr. Gaylyn Keas at [facility] in [place]--started Keppra ER 500mg  qd, diclofenac 25mg  delayed release tabs--1 q6h prn HA, ordered MRI brain (NORMAL 05/08/13) and sleep evaluation.   Nephrolithiasis    Obesity, Class I, BMI 30-34.9    OSA (obstructive sleep apnea)    uses cpap   Osteoarthritis of both ankles    Palpitations 10/2014   Dr. Alroy Aspen 11/2014; PVCs and PACs on Holter.     Plantar fasciitis, left 10/2019   Sleep apnea    does not use the CPAP   Tendonitis, Achilles, left 10/2019    Past Surgical History:  Procedure Laterality Date   ACHILLES TENDON LENGTHENING  48 yrs old   CHOLECYSTECTOMY N/A 05/13/2015   Procedure: LAPAROSCOPIC CHOLECYSTECTOMY;  Surgeon: Oza Blumenthal, MD;  Location: [facility] OR;  Service: General;  Laterality: N/A;   COLONOSCOPY  02/02/2020   2021 adenomas x 2, recall 5 yrs.   HERNIA REPAIR Right 1994   inguinal   kidney stone removed  1996   Dr. Bosie Bye  is urologist locally.   LITHOTRIPSY  1997 and 2005   TOE FUSION Right 03/23/2021   TOE FUSION Left 09/22/2021   TYMPANOSTOMY TUBE PLACEMENT  1981   UPPER GI ENDOSCOPY  10/10/2018   VASECTOMY      Family History  Problem Relation Age of Onset   Hypertension Mother    Hyperlipidemia Mother    Arthritis Mother    Heart attack Mother    Lung disease Mother    Arthritis Father    Hyperlipidemia Father    Hypertension Father    Heart disease Father        stents   Dementia Father    Macular degeneration Father    Hypertension Maternal Grandmother    Heart attack Maternal Grandfather    Lung disease Maternal Aunt    Colon cancer Neg Hx    Colon polyps Neg Hx    Esophageal cancer Neg Hx    Rectal cancer Neg Hx    Stomach cancer Neg Hx     Social History   Socioeconomic History   Marital status: Married    Spouse name: Not on file   Number of children: Not on file   Years of education: Not on file   Highest education level: Not on file  Occupational History   Not on  file  Tobacco Use   Smoking status: Former    Current packs/day: 0.00    Average packs/day: 1 pack/day for 11.0 years (11.0 ttl pk-yrs)    Types: Cigarettes    Start date: 10/24/1987    Quit date: 10/23/1998    Years since quitting: 25.4   Smokeless tobacco: Never  Vaping Use   Vaping status: Never Used  Substance and Sexual Activity   Alcohol use: No   Drug use: No   Sexual activity: Not on file  Other Topics Concern   Not on file  Social History Narrative   Married, 4 young children (3 natural, 1 adopted from [place]).   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   Originally from [place] .  No tobacco, alc, or drugs.   No formal exercise but works in yard daily.   No OTC decongestants.  16 oz coffee/day.   Social Drivers of Corporate investment banker Strain: Not on file  Food Insecurity: Not on file  Transportation Needs: Not on file  Physical Activity: Not on file  Stress: Not on  file  Social Connections: Not on file  Intimate Partner Violence: Not on file    Outpatient Medications Prior to Visit  Medication Sig Dispense Refill   irbesartan  (AVAPRO ) 150 MG tablet TAKE 1 TABLET DAILY (Patient not taking: Reported on 04/01/2024) 90 tablet 3   cephALEXin  (KEFLEX ) 500 MG capsule Take 1 capsule (500 mg total) by mouth 3 (three) times daily. (Patient not taking: Reported on 04/01/2024) 21 capsule 0   doxycycline  (VIBRA -TABS) 100 MG tablet Take 1 tablet (100 mg total) by mouth 2 (two) times daily. (Patient not taking: Reported on 04/01/2024) 10 tablet 0   doxycycline  (VIBRA -TABS) 100 MG tablet Take 1 tablet (100 mg total) by mouth 2 (two) times daily. (Patient not taking: Reported on 04/01/2024) 20 tablet 0   No facility-administered medications prior to visit.    No Known Allergies  Review of Systems  Constitutional:  Negative for appetite change, chills, fatigue and fever.  HENT:  Negative for congestion, dental problem, ear pain and sore throat.   Eyes:  Negative for discharge, redness and visual disturbance.  Respiratory:  Negative for cough, chest tightness, shortness of breath and wheezing.   Cardiovascular:  Negative for chest pain, palpitations and leg swelling.  Gastrointestinal:  Negative for abdominal pain, blood in stool, diarrhea, nausea and vomiting.  Genitourinary:  Negative for difficulty urinating, dysuria, flank pain, frequency, hematuria and urgency.  Musculoskeletal:  Negative for arthralgias, back pain, joint swelling, myalgias and neck stiffness.  Skin:  Negative for pallor and rash.  Neurological:  Negative for dizziness, speech difficulty, weakness and headaches.  Hematological:  Negative for adenopathy. Does not bruise/bleed easily.  Psychiatric/Behavioral:  Negative for confusion and sleep disturbance. The patient is not nervous/anxious.     PE;    04/01/2024   10:37 AM 02/17/2022    1:22 PM 11/18/2021    1:43 PM  Vitals with BMI  Height 5'  9.25" 5\' 9"  5\' 9"   Weight 235 lbs 6 oz 214 lbs 10 oz 230 lbs 10 oz  BMI 34.51 31.68 34.04  Systolic 118 117   Diastolic 70 73   Pulse 69 66    Gen: Alert, well appearing.  Patient is oriented to person, place, time, and situation. AFFECT: pleasant, lucid thought and speech. ENT: Ears: EACs clear, normal epithelium.  TMs with good light reflex and landmarks bilaterally.  Eyes: no injection, icteris, swelling, or exudate.  EOMI, PERRLA. Nose: no drainage or turbinate edema/swelling.  No injection or focal lesion.  Mouth: lips without lesion/swelling.  Oral mucosa pink and moist.  Dentition intact and without obvious caries or gingival swelling.  Oropharynx without erythema, exudate, or swelling.  Neck: supple/nontender.  No LAD, mass, or TM.  Carotid pulses 2+ bilaterally, without bruits. CV: RRR, no m/r/g.   LUNGS: CTA bilat, nonlabored resps, good aeration in all lung fields. ABD: soft, NT, ND, BS normal.  No hepatospenomegaly or mass.  No bruits. EXT: no clubbing, cyanosis, or edema.  Musculoskeletal: no joint swelling, erythema, warmth, or tenderness.  ROM of all joints intact. Skin - no sores or suspicious lesions or rashes or color changes  Pertinent labs:  Lab Results  Component Value Date   TSH 2.45 02/20/2022   Lab Results  Component Value Date   WBC 4.8 02/20/2022   HGB 14.4 02/20/2022   HCT 41.4 02/20/2022   MCV 87.2 02/20/2022   PLT 191.0 02/20/2022   Lab Results  Component Value Date   CREATININE 1.06 02/20/2022   BUN 19 02/20/2022   NA 133 (L) 02/20/2022   K 4.4 02/20/2022   CL 100 02/20/2022   CO2 25 02/20/2022   Lab Results  Component Value Date   ALT 23 02/20/2022   AST 20 02/20/2022   ALKPHOS 89 02/20/2022   BILITOT 0.5 02/20/2022   Lab Results  Component Value Date   CHOL 209 (H) 02/20/2022   Lab Results  Component Value Date   HDL 35.00 (L) 02/20/2022   Lab Results  Component Value Date   LDLCALC 146 (H) 02/20/2022   Lab Results   Component Value Date   TRIG 141.0 02/20/2022   Lab Results  Component Value Date   CHOLHDL 6 02/20/2022   Lab Results  Component Value Date   HGBA1C 5.0 02/20/2022   ASSESSMENT AND PLAN:   #1 health maintenance exam: Reviewed age and gender appropriate health maintenance issues (prudent diet, regular exercise, health risks of tobacco and excessive alcohol, use of seatbelts, fire alarms in home, use of sunscreen).  Also reviewed age and gender appropriate health screening as well as vaccine recommendations. Vaccines: Shingrix-->#1 today. Labs: Health panel--> ordered future when fasting. Prostate ca screening: PSA ordered. Colon ca screening: He has a istory of adenomas.  Recall approx 01/2025.  #2 hypertension, well-controlled on irbesartan  150 mg daily.   An After Visit Summary was printed and given to the patient.  FOLLOW UP:  Return in about 1 year (around 04/01/2025) for annual CPE (fasting).  Signed:  Arletha Lady, MD           04/01/2024

## 2024-04-02 ENCOUNTER — Other Ambulatory Visit

## 2024-04-02 DIAGNOSIS — Z125 Encounter for screening for malignant neoplasm of prostate: Secondary | ICD-10-CM

## 2024-04-02 DIAGNOSIS — I10 Essential (primary) hypertension: Secondary | ICD-10-CM

## 2024-04-03 ENCOUNTER — Ambulatory Visit: Payer: Self-pay | Admitting: Family Medicine

## 2024-04-03 ENCOUNTER — Encounter: Payer: Self-pay | Admitting: Family Medicine

## 2024-04-03 LAB — CBC WITH DIFFERENTIAL/PLATELET
Absolute Lymphocytes: 977 {cells}/uL (ref 850–3900)
Absolute Monocytes: 607 {cells}/uL (ref 200–950)
Basophils Absolute: 33 {cells}/uL (ref 0–200)
Basophils Relative: 0.5 %
Eosinophils Absolute: 132 {cells}/uL (ref 15–500)
Eosinophils Relative: 2 %
HCT: 44.2 % (ref 38.5–50.0)
Hemoglobin: 14.7 g/dL (ref 13.2–17.1)
MCH: 30.2 pg (ref 27.0–33.0)
MCHC: 33.3 g/dL (ref 32.0–36.0)
MCV: 90.8 fL (ref 80.0–100.0)
MPV: 11 fL (ref 7.5–12.5)
Monocytes Relative: 9.2 %
Neutro Abs: 4851 {cells}/uL (ref 1500–7800)
Neutrophils Relative %: 73.5 %
Platelets: 112 10*3/uL — ABNORMAL LOW (ref 140–400)
RBC: 4.87 10*6/uL (ref 4.20–5.80)
RDW: 15.1 % — ABNORMAL HIGH (ref 11.0–15.0)
Total Lymphocyte: 14.8 %
WBC: 6.6 10*3/uL (ref 3.8–10.8)

## 2024-04-03 LAB — TSH: TSH: 1.75 m[IU]/L (ref 0.40–4.50)

## 2024-04-03 LAB — LIPID PANEL
Cholesterol: 149 mg/dL (ref <200)
HDL: 31 mg/dL — ABNORMAL LOW (ref >=40)
LDL Cholesterol (Calc): 93 mg/dL
Non-HDL Cholesterol (Calc): 118 mg/dL (ref <130)
Total CHOL/HDL Ratio: 4.8 (calc) (ref <5.0)
Triglycerides: 156 mg/dL — ABNORMAL HIGH (ref <150)

## 2024-04-03 LAB — COMPREHENSIVE METABOLIC PANEL WITH GFR
AG Ratio: 1.6 (calc) (ref 1.0–2.5)
ALT: 33 U/L (ref 9–46)
AST: 35 U/L (ref 10–35)
Albumin: 4.1 g/dL (ref 3.6–5.1)
Alkaline phosphatase (APISO): 79 U/L (ref 35–144)
BUN: 14 mg/dL (ref 7–25)
CO2: 28 mmol/L (ref 20–32)
Calcium: 9 mg/dL (ref 8.6–10.3)
Chloride: 103 mmol/L (ref 98–110)
Creat: 1.03 mg/dL (ref 0.70–1.30)
Globulin: 2.5 g/dL (ref 1.9–3.7)
Glucose, Bld: 88 mg/dL (ref 65–99)
Potassium: 4.6 mmol/L (ref 3.5–5.3)
Sodium: 139 mmol/L (ref 135–146)
Total Bilirubin: 0.6 mg/dL (ref 0.2–1.2)
Total Protein: 6.6 g/dL (ref 6.1–8.1)
eGFR: 88 mL/min/{1.73_m2} (ref >=60)

## 2024-04-03 LAB — PSA: PSA: 0.2 ng/mL (ref <=4.00)

## 2024-04-07 ENCOUNTER — Ambulatory Visit: Admitting: Podiatry

## 2024-04-11 ENCOUNTER — Telehealth: Payer: Self-pay

## 2024-04-11 NOTE — Telephone Encounter (Signed)
 Form completed, pt made aware and will come pick by Monday to pick up.

## 2024-04-11 NOTE — Telephone Encounter (Signed)
 Signed and put in box to go up front. Signed:  Arletha Lady, MD           04/11/2024

## 2024-04-11 NOTE — Telephone Encounter (Signed)
 Patient dropped off wellness form for employer insurance program. Patient last CPE on 04/01/24 with Dr. McGowen.  Type of forms received:  [employer]  Routed to: [employer] received by :  Bernabe Brew   Individual made aware of 5-7 business day turn around (Y/N): Y  Form completed and patient made aware of charges(Y/N): N/A  Form placed in McGowen inbox front office.

## 2024-04-11 NOTE — Telephone Encounter (Signed)
Placed on PCP desk to review and sign, if appropriate.  

## 2024-07-30 DIAGNOSIS — Z23 Encounter for immunization: Secondary | ICD-10-CM | POA: Diagnosis not present
